# Patient Record
Sex: Male | Born: 1955
Health system: Southern US, Community
[De-identification: ages and names within clinical notes are randomized; demographics above are authoritative.]

## PROBLEM LIST (undated history)

## (undated) DIAGNOSIS — R7303 Prediabetes: Secondary | ICD-10-CM

## (undated) DIAGNOSIS — C4490 Unspecified malignant neoplasm of skin, unspecified: Secondary | ICD-10-CM

## (undated) DIAGNOSIS — E785 Hyperlipidemia, unspecified: Secondary | ICD-10-CM

## (undated) DIAGNOSIS — G4700 Insomnia, unspecified: Secondary | ICD-10-CM

## (undated) DIAGNOSIS — K219 Gastro-esophageal reflux disease without esophagitis: Secondary | ICD-10-CM

## (undated) DIAGNOSIS — F909 Attention-deficit hyperactivity disorder, unspecified type: Secondary | ICD-10-CM

## (undated) DIAGNOSIS — I10 Essential (primary) hypertension: Secondary | ICD-10-CM

## (undated) DIAGNOSIS — F17200 Nicotine dependence, unspecified, uncomplicated: Secondary | ICD-10-CM

## (undated) DIAGNOSIS — G2581 Restless legs syndrome: Secondary | ICD-10-CM

## (undated) DIAGNOSIS — G5622 Lesion of ulnar nerve, left upper limb: Secondary | ICD-10-CM

## (undated) DIAGNOSIS — M1990 Unspecified osteoarthritis, unspecified site: Secondary | ICD-10-CM

## (undated) HISTORY — DX: Hyperlipidemia, unspecified: E78.5

## (undated) HISTORY — DX: Unspecified osteoarthritis, unspecified site: M19.90

## (undated) HISTORY — DX: Lesion of ulnar nerve, left upper limb: G56.22

## (undated) HISTORY — DX: Attention-deficit hyperactivity disorder, unspecified type: F90.9

## (undated) HISTORY — PX: SKIN CANCER EXCISION: SHX779

## (undated) HISTORY — DX: Nicotine dependence, unspecified, uncomplicated: F17.200

## (undated) HISTORY — PX: THROAT SURGERY: SHX803

## (undated) HISTORY — DX: Restless legs syndrome: G25.81

## (undated) HISTORY — PX: WISDOM TOOTH EXTRACTION: SHX21

## (undated) HISTORY — PX: PROSTATE BIOPSY: SHX241

---

## 2012-03-16 ENCOUNTER — Encounter: Payer: Self-pay | Admitting: Internal Medicine

## 2012-03-16 ENCOUNTER — Ambulatory Visit (INDEPENDENT_AMBULATORY_CARE_PROVIDER_SITE_OTHER): Payer: PRIVATE HEALTH INSURANCE | Admitting: Internal Medicine

## 2012-03-16 ENCOUNTER — Ambulatory Visit: Payer: PRIVATE HEALTH INSURANCE

## 2012-03-16 VITALS — BP 128/88 | HR 67 | Temp 98.2°F | Resp 16 | Ht 68.0 in | Wt 182.0 lb

## 2012-03-16 DIAGNOSIS — Z0000 Encounter for general adult medical examination without abnormal findings: Secondary | ICD-10-CM

## 2012-03-16 DIAGNOSIS — G4700 Insomnia, unspecified: Secondary | ICD-10-CM

## 2012-03-16 DIAGNOSIS — N529 Male erectile dysfunction, unspecified: Secondary | ICD-10-CM

## 2012-03-16 DIAGNOSIS — F17200 Nicotine dependence, unspecified, uncomplicated: Secondary | ICD-10-CM | POA: Insufficient documentation

## 2012-03-16 DIAGNOSIS — F988 Other specified behavioral and emotional disorders with onset usually occurring in childhood and adolescence: Secondary | ICD-10-CM

## 2012-03-16 DIAGNOSIS — F909 Attention-deficit hyperactivity disorder, unspecified type: Secondary | ICD-10-CM

## 2012-03-16 LAB — COMPREHENSIVE METABOLIC PANEL
AST: 23 U/L (ref 0–37)
Albumin: 4.2 g/dL (ref 3.5–5.2)
Alkaline Phosphatase: 58 U/L (ref 39–117)
BUN: 14 mg/dL (ref 6–23)
Creat: 0.89 mg/dL (ref 0.50–1.35)
Glucose, Bld: 97 mg/dL (ref 70–99)
Potassium: 3.8 mEq/L (ref 3.5–5.3)

## 2012-03-16 LAB — POCT UA - MICROSCOPIC ONLY
Bacteria, U Microscopic: NEGATIVE
Casts, Ur, LPF, POC: NEGATIVE
Crystals, Ur, HPF, POC: NEGATIVE
Mucus, UA: NEGATIVE
Yeast, UA: NEGATIVE

## 2012-03-16 LAB — LIPID PANEL
Cholesterol: 188 mg/dL (ref 0–200)
Total CHOL/HDL Ratio: 3.6 Ratio
Triglycerides: 190 mg/dL — ABNORMAL HIGH (ref <150)

## 2012-03-16 LAB — POCT URINALYSIS DIPSTICK
Blood, UA: NEGATIVE
Glucose, UA: NEGATIVE
Nitrite, UA: NEGATIVE
Spec Grav, UA: 1.02
Urobilinogen, UA: 1
pH, UA: 7.5

## 2012-03-16 MED ORDER — ZOLPIDEM TARTRATE 10 MG PO TABS: 10.0000 mg | ORAL_TABLET | Freq: Every evening | ORAL | Status: DC | PRN

## 2012-03-16 MED ORDER — AMPHETAMINE-DEXTROAMPHET ER 20 MG PO CP24: 20.0000 mg | ORAL_CAPSULE | ORAL | Status: DC

## 2012-03-16 MED ORDER — TADALAFIL 20 MG PO TABS: 20.0000 mg | ORAL_TABLET | Freq: Every day | ORAL | Status: DC | PRN

## 2012-03-16 NOTE — Progress Notes (Signed)
  Subjective:    Patient ID: [name], male    DOB: October 10, 1956, 56 y.o.   MRN: [id number]  HPI Hx ADD, ED, Insomnia all chronic issues and none severe.  See scanned hx Review of Systems See scanned ROS    Objective:   Physical Exam  Constitutional: He is oriented to person, place, and time. He appears well-developed and well-nourished.  HENT:  Head: Normocephalic.  Nose: Nose normal.  Eyes: EOM are normal.  Neck: Normal range of motion. Neck supple. No thyromegaly present.  Cardiovascular: Normal rate, regular rhythm and normal heart sounds.   Pulmonary/Chest: Breath sounds normal.  Abdominal: Soft. Bowel sounds are normal. He exhibits no mass.  Genitourinary: Rectum normal, prostate normal and penis normal.  Musculoskeletal: Normal range of motion.  Lymphadenopathy:    He has no cervical adenopathy.  Neurological: He is alert and oriented to person, place, and time. He has normal reflexes. No cranial nerve deficit. Coordination normal.  Skin: Skin is dry.  Psychiatric: He has a normal mood and affect.          Assessment & Plan:  Refer for colonoscopy Trial of adderallxr 20mg  qd Trial zolpidem 10mg  qh RF viagra 50mg  RTC 1 month

## 2012-03-16 NOTE — Patient Instructions (Signed)
Attention Deficit Hyperactivity Disorder Attention deficit hyperactivity disorder (ADHD) is a problem with behavior issues based on the way the brain functions (neurobehavioral disorder). It is a common reason for behavior and academic problems in school. CAUSES  The cause of ADHD is unknown in most cases. It may run in families. It sometimes can be associated with learning disabilities and other behavioral problems. SYMPTOMS  There are 3 types of ADHD. The 3 types and some of the symptoms include:  Inattentive   Gets bored or distracted easily.   Loses or forgets things. Forgets to hand in homework.   Has trouble organizing or completing tasks.   Difficulty staying on task.   An inability to organize daily tasks and school work.   Leaving projects, chores, or homework unfinished.   Trouble paying attention or responding to details. Careless mistakes.   Difficulty following directions. Often seems like is not listening.   Dislikes activities that require sustained attention (like chores or homework).   Hyperactive-impulsive   Feels like it is impossible to sit still or stay in a seat. Fidgeting with hands and feet.   Trouble waiting turn.   Talking too much or out of turn. Interruptive.   Speaks or acts impulsively.   Aggressive, disruptive behavior.   Constantly busy or on the go, noisy.   Combined   Has symptoms of both of the above.  Often children with ADHD feel discouraged about themselves and with school. They often perform well below their abilities in school. These symptoms can cause problems in home, school, and in relationships with peers. As children get older, the excess motor activities can calm down, but the problems with paying attention and staying organized persist. Most children do not outgrow ADHD but with good treatment can learn to cope with the symptoms. DIAGNOSIS  When ADHD is suspected, the diagnosis should be made by professionals trained in  ADHD.  Diagnosis will include:  Ruling out other reasons for the child's behavior.   The caregivers will check with the child's school and check their medical records.   They will talk to teachers and parents.   Behavior rating scales for the child will be filled out by those dealing with the child on a daily basis.  A diagnosis is made only after all information has been considered. TREATMENT  Treatment usually includes behavioral treatment often along with medicines. It may include stimulant medicines. The stimulant medicines decrease impulsivity and hyperactivity and increase attention. Other medicines used include antidepressants and certain blood pressure medicines. Most experts agree that treatment for ADHD should address all aspects of the child's functioning. Treatment should not be limited to the use of medicines alone. Treatment should include structured classroom management. The parents must receive education to address rewarding good behavior, discipline, and limit-setting. Tutoring or behavioral therapy or both should be available for the child. If untreated, the disorder can have long-term serious effects into adolescence and adulthood. HOME CARE INSTRUCTIONS   Often with ADHD there is a lot of frustration among the family in dealing with the illness. There is often blame and anger that is not warranted. This is a life long illness. There is no way to prevent ADHD. In many cases, because the problem affects the family as a whole, the entire family may need help. A therapist can help the family find better ways to handle the disruptive behaviors and promote change. If the child is young, most of the therapist's work is with the parents. Parents will   learn techniques for coping with and improving their child's behavior. Sometimes only the child with the ADHD needs counseling. Your caregivers can help you make these decisions.   Children with ADHD may need help in organizing. Some  helpful tips include:   Keep routines the same every day from wake-up time to bedtime. Schedule everything. This includes homework and playtime. This should include outdoor and indoor recreation. Keep the schedule on the refrigerator or a bulletin board where it is frequently seen. Mark schedule changes as far in advance as possible.   Have a place for everything and keep everything in its place. This includes clothing, backpacks, and school supplies.   Encourage writing down assignments and bringing home needed books.   Offer your child a well-balanced diet. Breakfast is especially important for school performance. Children should avoid drinks with caffeine including:   Soft drinks.   Coffee.   Tea.   However, some older children (adolescents) may find these drinks helpful in improving their attention.   Children with ADHD need consistent rules that they can understand and follow. If rules are followed, give small rewards. Children with ADHD often receive, and expect, criticism. Look for good behavior and praise it. Set realistic goals. Give clear instructions. Look for activities that can foster success and self-esteem. Make time for pleasant activities with your child. Give lots of affection.   Parents are their children's greatest advocates. Learn as much as possible about ADHD. This helps you become a stronger and better advocate for your child. It also helps you educate your child's teachers and instructors if they feel inadequate in these areas. Parent support groups are often helpful. A national group with local chapters is called CHADD (Children and Adults with Attention Deficit Hyperactivity Disorder).  PROGNOSIS  There is no cure for ADHD. Children with the disorder seldom outgrow it. Many find adaptive ways to accommodate the ADHD as they mature. SEEK MEDICAL CARE IF:  Your child has repeated muscle twitches, cough or speech outbursts.   Your child has sleep problems.   Your  child has a marked loss of appetite.   Your child develops depression.   Your child has new or worsening behavioral problems.   Your child develops dizziness.   Your child has a racing heart.   Your child has stomach pains.   Your child develops headaches.  Document Released: 11/08/2002 Document Revised: 11/07/2011 Document Reviewed: 06/20/2008 [facility] Patient Information 2012 [place], [place].Insomnia Insomnia is frequent trouble falling and/or staying asleep. Insomnia can be a long term problem or a short term problem. Both are common. Insomnia can be a short term problem when the wakefulness is related to a certain stress or worry. Long term insomnia is often related to ongoing stress during waking hours and/or poor sleeping habits. Overtime, sleep deprivation itself can make the problem worse. Every little thing feels more severe because you are overtired and your ability to cope is decreased. CAUSES   Stress, anxiety, and depression.   Poor sleeping habits.   Distractions such as TV in the bedroom.   Naps close to bedtime.   Engaging in emotionally charged conversations before bed.   Technical reading before sleep.   Alcohol and other sedatives. They may make the problem worse. They can hurt normal sleep patterns and normal dream activity.   Stimulants such as caffeine for several hours prior to bedtime.   Pain syndromes and shortness of breath can cause insomnia.   Exercise late at night.   Changing  time zones may cause sleeping problems (jet lag).  It is sometimes helpful to have someone observe your sleeping patterns. They should look for periods of not breathing during the night (sleep apnea). They should also look to see how long those periods last. If you live alone or observers are uncertain, you can also be observed at a sleep clinic where your sleep patterns will be professionally monitored. Sleep apnea requires a checkup and treatment. Give your caregivers your  medical history. Give your caregivers observations your family has made about your sleep.  SYMPTOMS   Not feeling rested in the morning.   Anxiety and restlessness at bedtime.   Difficulty falling and staying asleep.  TREATMENT   Your caregiver may prescribe treatment for an underlying medical disorders. Your caregiver can give advice or help if you are using alcohol or other drugs for self-medication. Treatment of underlying problems will usually eliminate insomnia problems.   Medications can be prescribed for short time use. They are generally not recommended for lengthy use.   Over-the-counter sleep medicines are not recommended for lengthy use. They can be habit forming.   You can promote easier sleeping by making lifestyle changes such as:   Using relaxation techniques that help with breathing and reduce muscle tension.   Exercising earlier in the day.   Changing your diet and the time of your last meal. No night time snacks.   Establish a regular time to go to bed.   Counseling can help with stressful problems and worry.   Soothing music and white noise may be helpful if there are background noises you cannot remove.   Stop tedious detailed work at least one hour before bedtime.  HOME CARE INSTRUCTIONS   Keep a diary. Inform your caregiver about your progress. This includes any medication side effects. See your caregiver regularly. Take note of:   Times when you are asleep.   Times when you are awake during the night.   The quality of your sleep.   How you feel the next day.  This information will help your caregiver care for you.  Get out of bed if you are still awake after 15 minutes. Read or do some quiet activity. Keep the lights down. Wait until you feel sleepy and go back to bed.   Keep regular sleeping and waking hours. Avoid naps.   Exercise regularly.   Avoid distractions at bedtime. Distractions include watching television or engaging in any intense  or detailed activity like attempting to balance the household checkbook.   Develop a bedtime ritual. Keep a familiar routine of bathing, brushing your teeth, climbing into bed at the same time each night, listening to soothing music. Routines increase the success of falling to sleep faster.   Use relaxation techniques. This can be using breathing and muscle tension release routines. It can also include visualizing peaceful scenes. You can also help control troubling or intruding thoughts by keeping your mind occupied with boring or repetitive thoughts like the old concept of counting sheep. You can make it more creative like imagining planting one beautiful flower after another in your backyard garden.   During your day, work to eliminate stress. When this is not possible use some of the previous suggestions to help reduce the anxiety that accompanies stressful situations.  MAKE SURE YOU:   Understand these instructions.   Will watch your condition.   Will get help right away if you are not doing well or get worse.  Document Released:  11/15/2000 Document Revised: 11/07/2011 Document Reviewed: 12/16/2007 [facility] Patient Information 2012 [place], [place].

## 2012-03-17 LAB — PSA: PSA: 2.75 ng/mL (ref <=4.00)

## 2012-03-18 ENCOUNTER — Encounter: Payer: Self-pay | Admitting: Gastroenterology

## 2012-04-08 ENCOUNTER — Telehealth: Payer: Self-pay | Admitting: *Deleted

## 2012-04-08 NOTE — Telephone Encounter (Signed)
Pt did not show for previsit appointment 04-08-12 9:00 am. Message left to call and reschedule previsit appointment by 5pm today or colonoscopy scheduled 04-22-12 would be cancelled and both previsit and colonoscopy appointments would need to be rescheduled.

## 2012-04-13 ENCOUNTER — Ambulatory Visit (AMBULATORY_SURGERY_CENTER): Payer: Self-pay | Admitting: *Deleted

## 2012-04-13 VITALS — Ht 68.5 in | Wt 183.6 lb

## 2012-04-13 DIAGNOSIS — Z1211 Encounter for screening for malignant neoplasm of colon: Secondary | ICD-10-CM

## 2012-04-13 MED ORDER — PEG-KCL-NACL-NASULF-NA ASC-C 100 G PO SOLR: ORAL | Status: DC

## 2012-04-22 ENCOUNTER — Encounter: Payer: Self-pay | Admitting: Gastroenterology

## 2012-04-22 ENCOUNTER — Ambulatory Visit (AMBULATORY_SURGERY_CENTER): Payer: PRIVATE HEALTH INSURANCE | Admitting: Gastroenterology

## 2012-04-22 VITALS — BP 123/71 | HR 78 | Temp 98.6°F | Resp 20 | Ht 68.0 in | Wt 183.0 lb

## 2012-04-22 DIAGNOSIS — Z1211 Encounter for screening for malignant neoplasm of colon: Secondary | ICD-10-CM

## 2012-04-22 DIAGNOSIS — D126 Benign neoplasm of colon, unspecified: Secondary | ICD-10-CM

## 2012-04-22 MED ORDER — SODIUM CHLORIDE 0.9 % IV SOLN: 500.0000 mL | INTRAVENOUS | Status: DC

## 2012-04-22 NOTE — Op Note (Signed)
[facility] [address] [place], [place]  [address]  COLONOSCOPY PROCEDURE REPORT  PATIENT:  [name], [name]  MR#:  [id number] BIRTHDATE:  October 19, 1956, 55 yrs. old  GENDER:  male ENDOSCOPIST:  Vania Rea. Jarold Motto, MD, [facility] REF. BY:  Robert Bellow, M.D. PROCEDURE DATE:  04/22/2012 PROCEDURE:  Average-risk screening colonoscopy G0121 ASA CLASS:  Class I INDICATIONS:  Routine Risk Screening MEDICATIONS:   propofol (Diprivan) 550 iv  DESCRIPTION OF PROCEDURE:   After the risks and benefits and of the procedure were explained, informed consent was obtained. Digital rectal exam was performed and revealed no abnormalities. The LB CF-H180AL [id number] endoscope was introduced through the anus and advanced to the cecum, which was identified by both the appendix and ileocecal valve.  The quality of the prep was good, using MoviPrep.  The instrument was then slowly withdrawn as the colon was fully examined. <<PROCEDUREIMAGES>>  FINDINGS:  A sessile polyp was found. 3-4 mm flat polyp seen on a sharp twist in very redundant,redundant left con at or near spleenic flexure.COUD NOT POSITION THE SCOPE TO REMOVE !!  This was otherwise a normal examination of the colon. SEE PICTURES. Retroflexed views in the rectum revealed no abnormalities.    The scope was then withdrawn from the patient and the procedure completed.  COMPLICATIONS:  None ENDOSCOPIC IMPRESSION: 1) Sessile polyp 2) Otherwise normal examination RECOMMENDATIONS: 1) Repeat Colonoscopy in 3 years.  REPEAT EXAM:  No  ______________________________ Vania Rea. Jarold Motto, MD, [facility]  CC:  n. eSIGNED:   Vania Rea. Anatole Apollo at 04/22/2012 10:26 AM  Otelia Santee, [phone]

## 2012-04-22 NOTE — Progress Notes (Signed)
Patient did not experience any of the following events: a burn prior to discharge; a fall within the facility; wrong site/side/patient/procedure/implant event; or a hospital transfer or hospital admission upon discharge from the facility. (G8907) Patient did not have preoperative order for IV antibiotic SSI prophylaxis. (G8918)  

## 2012-04-22 NOTE — Patient Instructions (Signed)
Impressions/Recommendations:  Polyp (handout given) Unable to remove.  Repeat colonoscopy in 3 years.  YOU HAD AN ENDOSCOPIC PROCEDURE TODAY AT THE [facility]: Refer to the procedure report that was given to you for any specific questions about what was found during the examination.  If the procedure report does not answer your questions, please call your gastroenterologist to clarify.  If you requested that your care partner not be given the details of your procedure findings, then the procedure report has been included in a sealed envelope for you to review at your convenience later.  YOU SHOULD EXPECT: Some feelings of bloating in the abdomen. Passage of more gas than usual.  Walking can help get rid of the air that was put into your GI tract during the procedure and reduce the bloating. If you had a lower endoscopy (such as a colonoscopy or flexible sigmoidoscopy) you may notice spotting of blood in your stool or on the toilet paper. If you underwent a bowel prep for your procedure, then you may not have a normal bowel movement for a few days.  DIET: Your first meal following the procedure should be a light meal and then it is ok to progress to your normal diet.  A half-sandwich or bowl of soup is an example of a good first meal.  Heavy or fried foods are harder to digest and may make you feel nauseous or bloated.  Likewise meals heavy in dairy and vegetables can cause extra gas to form and this can also increase the bloating.  Drink plenty of fluids but you should avoid alcoholic beverages for 24 hours.  ACTIVITY: Your care partner should take you home directly after the procedure.  You should plan to take it easy, moving slowly for the rest of the day.  You can resume normal activity the day after the procedure however you should NOT DRIVE or use heavy machinery for 24 hours (because of the sedation medicines used during the test).    SYMPTOMS TO REPORT IMMEDIATELY: A  gastroenterologist can be reached at any hour.  During normal business hours, 8:30 AM to 5:00 PM Monday through Friday, call [phone].  After hours and on weekends, please call the GI answering service at [phone] who will take a message and have the physician on call contact you.   Following lower endoscopy (colonoscopy or flexible sigmoidoscopy):  Excessive amounts of blood in the stool  Significant tenderness or worsening of abdominal pains  Swelling of the abdomen that is new, acute  Fever of 100F or higher  Following upper endoscopy (EGD)  Vomiting of blood or coffee ground material  New chest pain or pain under the shoulder blades  Painful or persistently difficult swallowing  New shortness of breath  Fever of 100F or higher  Black, tarry-looking stools  FOLLOW UP: If any biopsies were taken you will be contacted by phone or by letter within the next 1-3 weeks.  Call your gastroenterologist if you have not heard about the biopsies in 3 weeks.  Our staff will call the home number listed on your records the next business day following your procedure to check on you and address any questions or concerns that you may have at that time regarding the information given to you following your procedure. This is a courtesy call and so if there is no answer at the home number and we have not heard from you through the emergency physician on call, we will assume that you  have returned to your regular daily activities without incident.  SIGNATURES/CONFIDENTIALITY: You and/or your care partner have signed paperwork which will be entered into your electronic medical record.  These signatures attest to the fact that that the information above on your After Visit Summary has been reviewed and is understood.  Full responsibility of the confidentiality of this discharge information lies with you and/or your care-partner.  

## 2012-04-22 NOTE — Progress Notes (Signed)
Propofol given and oxygen managed per D Merritt CRNA 

## 2012-04-23 ENCOUNTER — Telehealth: Payer: Self-pay

## 2012-04-23 NOTE — Telephone Encounter (Signed)
  Follow up Call-  Call back number 04/22/2012  Post procedure Call Back phone  # [phone]  Permission to leave phone message Yes     Patient questions:  Do you have a fever, pain , or abdominal swelling? no Pain Score  0 *  Have you tolerated food without any problems? yes  Have you been able to return to your normal activities? yes  Do you have any questions about your discharge instructions: Diet   no Medications  no Follow up visit  no  Do you have questions or concerns about your Care? no  Actions: * If pain score is 4 or above: No action needed, pain <4.

## 2012-05-04 ENCOUNTER — Ambulatory Visit (INDEPENDENT_AMBULATORY_CARE_PROVIDER_SITE_OTHER): Payer: PRIVATE HEALTH INSURANCE | Admitting: Internal Medicine

## 2012-05-04 ENCOUNTER — Encounter: Payer: Self-pay | Admitting: Internal Medicine

## 2012-05-04 VITALS — BP 144/98 | HR 76 | Temp 96.9°F | Resp 16 | Ht 68.0 in | Wt 177.8 lb

## 2012-05-04 DIAGNOSIS — F988 Other specified behavioral and emotional disorders with onset usually occurring in childhood and adolescence: Secondary | ICD-10-CM

## 2012-05-04 DIAGNOSIS — N529 Male erectile dysfunction, unspecified: Secondary | ICD-10-CM

## 2012-05-04 DIAGNOSIS — Z7189 Other specified counseling: Secondary | ICD-10-CM

## 2012-05-04 DIAGNOSIS — G4700 Insomnia, unspecified: Secondary | ICD-10-CM

## 2012-05-04 DIAGNOSIS — F909 Attention-deficit hyperactivity disorder, unspecified type: Secondary | ICD-10-CM

## 2012-05-04 MED ORDER — AMPHETAMINE-DEXTROAMPHET ER 20 MG PO CP24: 20.0000 mg | ORAL_CAPSULE | ORAL | Status: DC

## 2012-05-04 NOTE — Progress Notes (Signed)
  Subjective:    Patient ID: [name], male    DOB: July 20, 1956, 56 y.o.   MRN: [id number]  HPI ADHD is much better,work is very busy and he is clearly more efficient. BP elevated a little, appetite ok, and no other side affects  Needs baseline EKG    Review of Systems    see ROS scanned Objective:   Physical Exam COR normal BP  EKG nl     Assessment & Plan:  ADHD improved  Will continue adderall xr 20mg  qd,  Ok to refill 4 mos.

## 2012-05-04 NOTE — Patient Instructions (Signed)
Attention Deficit Hyperactivity Disorder Attention deficit hyperactivity disorder (ADHD) is a problem with behavior issues based on the way the brain functions (neurobehavioral disorder). It is a common reason for behavior and academic problems in school. CAUSES  The cause of ADHD is unknown in most cases. It may run in families. It sometimes can be associated with learning disabilities and other behavioral problems. SYMPTOMS  There are 3 types of ADHD. The 3 types and some of the symptoms include:  Inattentive   Gets bored or distracted easily.   Loses or forgets things. Forgets to hand in homework.   Has trouble organizing or completing tasks.   Difficulty staying on task.   An inability to organize daily tasks and school work.   Leaving projects, chores, or homework unfinished.   Trouble paying attention or responding to details. Careless mistakes.   Difficulty following directions. Often seems like is not listening.   Dislikes activities that require sustained attention (like chores or homework).   Hyperactive-impulsive   Feels like it is impossible to sit still or stay in a seat. Fidgeting with hands and feet.   Trouble waiting turn.   Talking too much or out of turn. Interruptive.   Speaks or acts impulsively.   Aggressive, disruptive behavior.   Constantly busy or on the go, noisy.   Combined   Has symptoms of both of the above.  Often children with ADHD feel discouraged about themselves and with school. They often perform well below their abilities in school. These symptoms can cause problems in home, school, and in relationships with peers. As children get older, the excess motor activities can calm down, but the problems with paying attention and staying organized persist. Most children do not outgrow ADHD but with good treatment can learn to cope with the symptoms. DIAGNOSIS  When ADHD is suspected, the diagnosis should be made by professionals trained in  ADHD.  Diagnosis will include:  Ruling out other reasons for the child's behavior.   The caregivers will check with the child's school and check their medical records.   They will talk to teachers and parents.   Behavior rating scales for the child will be filled out by those dealing with the child on a daily basis.  A diagnosis is made only after all information has been considered. TREATMENT  Treatment usually includes behavioral treatment often along with medicines. It may include stimulant medicines. The stimulant medicines decrease impulsivity and hyperactivity and increase attention. Other medicines used include antidepressants and certain blood pressure medicines. Most experts agree that treatment for ADHD should address all aspects of the child's functioning. Treatment should not be limited to the use of medicines alone. Treatment should include structured classroom management. The parents must receive education to address rewarding good behavior, discipline, and limit-setting. Tutoring or behavioral therapy or both should be available for the child. If untreated, the disorder can have long-term serious effects into adolescence and adulthood. HOME CARE INSTRUCTIONS   Often with ADHD there is a lot of frustration among the family in dealing with the illness. There is often blame and anger that is not warranted. This is a life long illness. There is no way to prevent ADHD. In many cases, because the problem affects the family as a whole, the entire family may need help. A therapist can help the family find better ways to handle the disruptive behaviors and promote change. If the child is young, most of the therapist's work is with the parents. Parents will   learn techniques for coping with and improving their child's behavior. Sometimes only the child with the ADHD needs counseling. Your caregivers can help you make these decisions.   Children with ADHD may need help in organizing. Some  helpful tips include:   Keep routines the same every day from wake-up time to bedtime. Schedule everything. This includes homework and playtime. This should include outdoor and indoor recreation. Keep the schedule on the refrigerator or a bulletin board where it is frequently seen. Mark schedule changes as far in advance as possible.   Have a place for everything and keep everything in its place. This includes clothing, backpacks, and school supplies.   Encourage writing down assignments and bringing home needed books.   Offer your child a well-balanced diet. Breakfast is especially important for school performance. Children should avoid drinks with caffeine including:   Soft drinks.   Coffee.   Tea.   However, some older children (adolescents) may find these drinks helpful in improving their attention.   Children with ADHD need consistent rules that they can understand and follow. If rules are followed, give small rewards. Children with ADHD often receive, and expect, criticism. Look for good behavior and praise it. Set realistic goals. Give clear instructions. Look for activities that can foster success and self-esteem. Make time for pleasant activities with your child. Give lots of affection.   Parents are their children's greatest advocates. Learn as much as possible about ADHD. This helps you become a stronger and better advocate for your child. It also helps you educate your child's teachers and instructors if they feel inadequate in these areas. Parent support groups are often helpful. A national group with local chapters is called CHADD (Children and Adults with Attention Deficit Hyperactivity Disorder).  PROGNOSIS  There is no cure for ADHD. Children with the disorder seldom outgrow it. Many find adaptive ways to accommodate the ADHD as they mature. SEEK MEDICAL CARE IF:  Your child has repeated muscle twitches, cough or speech outbursts.   Your child has sleep problems.   Your  child has a marked loss of appetite.   Your child develops depression.   Your child has new or worsening behavioral problems.   Your child develops dizziness.   Your child has a racing heart.   Your child has stomach pains.   Your child develops headaches.  Document Released: 11/08/2002 Document Revised: 11/07/2011 Document Reviewed: 06/20/2008 [employer] Patient Information 2012 [employer]. 

## 2012-05-20 ENCOUNTER — Other Ambulatory Visit: Payer: Self-pay | Admitting: Internal Medicine

## 2012-05-27 ENCOUNTER — Encounter: Payer: Self-pay | Admitting: Internal Medicine

## 2012-06-29 ENCOUNTER — Other Ambulatory Visit: Payer: Self-pay | Admitting: *Deleted

## 2012-06-29 ENCOUNTER — Other Ambulatory Visit: Payer: Self-pay | Admitting: Physician Assistant

## 2012-07-03 ENCOUNTER — Other Ambulatory Visit: Payer: Self-pay | Admitting: Physician Assistant

## 2012-10-19 ENCOUNTER — Encounter: Payer: Self-pay | Admitting: Internal Medicine

## 2012-10-19 ENCOUNTER — Ambulatory Visit (INDEPENDENT_AMBULATORY_CARE_PROVIDER_SITE_OTHER): Payer: PRIVATE HEALTH INSURANCE | Admitting: Internal Medicine

## 2012-10-19 VITALS — BP 134/84 | HR 68 | Temp 98.0°F | Resp 16 | Ht 69.0 in | Wt 186.0 lb

## 2012-10-19 DIAGNOSIS — F909 Attention-deficit hyperactivity disorder, unspecified type: Secondary | ICD-10-CM

## 2012-10-19 DIAGNOSIS — N529 Male erectile dysfunction, unspecified: Secondary | ICD-10-CM

## 2012-10-19 DIAGNOSIS — Z7189 Other specified counseling: Secondary | ICD-10-CM

## 2012-10-19 DIAGNOSIS — G4700 Insomnia, unspecified: Secondary | ICD-10-CM

## 2012-10-19 DIAGNOSIS — Z79899 Other long term (current) drug therapy: Secondary | ICD-10-CM

## 2012-10-19 MED ORDER — ZOLPIDEM TARTRATE 10 MG PO TABS: 10.0000 mg | ORAL_TABLET | Freq: Every evening | ORAL | Status: DC | PRN

## 2012-10-19 MED ORDER — AMPHETAMINE-DEXTROAMPHET ER 20 MG PO CP24: 20.0000 mg | ORAL_CAPSULE | ORAL | Status: DC

## 2012-10-19 MED ORDER — TADALAFIL 20 MG PO TABS: 20.0000 mg | ORAL_TABLET | Freq: Every day | ORAL | Status: DC | PRN

## 2012-10-19 NOTE — Progress Notes (Signed)
  Subjective:    Patient ID: [name], male    DOB: 1956-11-14, 56 y.o.   MRN: [id number]  HPI ADHD doing well with prn big work day adderall Insomnia doing well with prn zopidem ED doing well with prn cialis   Review of Systems     Objective:   Physical Exam  Constitutional: He is oriented to person, place, and time. He appears well-developed and well-nourished. No distress.  Eyes: EOM are normal.  Cardiovascular: Normal rate, regular rhythm and normal heart sounds.   Pulmonary/Chest: Breath sounds normal.  Neurological: He is alert and oriented to person, place, and time. Coordination normal.  Psychiatric: He has a normal mood and affect. His behavior is normal. Judgment and thought content normal.          Assessment & Plan:  RF adderall xr 20mg  3 mos RF zolpidem 10mg  6 mos Rf cialis 1 yr

## 2012-10-19 NOTE — Patient Instructions (Signed)

## 2012-12-10 ENCOUNTER — Telehealth: Payer: Self-pay

## 2012-12-10 NOTE — Telephone Encounter (Signed)
Patient wants to know if he had the chicken poxs.  Best number [phone]

## 2012-12-10 NOTE — Telephone Encounter (Signed)
Spoke with pt advised we dont have any record of him having chicken pox. I offered to have him RTC to check a varicella titer, pt will see if he can get it done at his physical.

## 2012-12-18 ENCOUNTER — Telehealth: Payer: Self-pay

## 2012-12-18 DIAGNOSIS — N529 Male erectile dysfunction, unspecified: Secondary | ICD-10-CM

## 2012-12-18 DIAGNOSIS — Z7189 Other specified counseling: Secondary | ICD-10-CM

## 2012-12-18 DIAGNOSIS — G4700 Insomnia, unspecified: Secondary | ICD-10-CM

## 2012-12-18 DIAGNOSIS — F909 Attention-deficit hyperactivity disorder, unspecified type: Secondary | ICD-10-CM

## 2012-12-18 NOTE — Telephone Encounter (Signed)
Patient would like refill of adderall.

## 2012-12-18 NOTE — Telephone Encounter (Signed)
OK to rf 4 months then come see me

## 2012-12-20 NOTE — Telephone Encounter (Signed)
Dr Noel Gerold not allowed to print RX's for refills on Adderall. Can you print these and I will have you sign at your next scheduled shift? Thank you

## 2012-12-22 MED ORDER — AMPHETAMINE-DEXTROAMPHET ER 20 MG PO CP24: 20.0000 mg | ORAL_CAPSULE | ORAL | Status: DC

## 2012-12-22 NOTE — Telephone Encounter (Signed)
I need to review, and see when his is due, he may be okay for another couple months. He may actually may not be due for follow up until April.

## 2012-12-22 NOTE — Telephone Encounter (Signed)
Ok this month then due for follow up

## 2012-12-23 ENCOUNTER — Telehealth: Payer: Self-pay

## 2012-12-23 NOTE — Telephone Encounter (Signed)
RX printed, awaiting signature from Dr Perrin Maltese. He will not be in until Friday. Pt notified about status.

## 2012-12-23 NOTE — Telephone Encounter (Signed)
error 

## 2012-12-23 NOTE — Telephone Encounter (Signed)
Patient called about rx. Spoke to [place], she will see if Dr Perrin Maltese can print it. Please call patient when ready.  Best [phone]

## 2012-12-24 NOTE — Telephone Encounter (Signed)
Patients rx ready to be picked up, he is advised.

## 2013-02-03 ENCOUNTER — Telehealth: Payer: Self-pay

## 2013-02-03 DIAGNOSIS — Z7189 Other specified counseling: Secondary | ICD-10-CM

## 2013-02-03 DIAGNOSIS — F909 Attention-deficit hyperactivity disorder, unspecified type: Secondary | ICD-10-CM

## 2013-02-03 DIAGNOSIS — N529 Male erectile dysfunction, unspecified: Secondary | ICD-10-CM

## 2013-02-03 DIAGNOSIS — G4700 Insomnia, unspecified: Secondary | ICD-10-CM

## 2013-02-03 NOTE — Telephone Encounter (Signed)
PT IN NEED OF HIS ADDERALL. PLEASE CALL [phone] WHEN READY FOR P/U

## 2013-02-11 ENCOUNTER — Other Ambulatory Visit: Payer: Self-pay | Admitting: Radiology

## 2013-02-11 ENCOUNTER — Telehealth: Payer: Self-pay

## 2013-02-11 ENCOUNTER — Telehealth: Payer: Self-pay | Admitting: Radiology

## 2013-02-11 NOTE — Telephone Encounter (Signed)
Patient advised of need for office visit. He is given Dr Perrin Maltese office hours.he will come in tomorrow am.

## 2013-02-11 NOTE — Telephone Encounter (Signed)
PT WANTED TO MAKE SURE SOMEONE KNOW THAT HE IS REALLY UNHAPPY ABOUT THE WAY WE DO THINGS NOW AND NO ONE ELSE CAN AUTHORIZED A REFILL EXCEPT DR GUEST WOULD LIKE AN EXPLANATION ABOUT IT. WAITING ON HIS ADDERALL AND I ADVISED PT TO COME IN. PLEASE CALL [phone]

## 2013-02-11 NOTE — Telephone Encounter (Signed)
Policy for controlled substances explained to him again he can choose another provider if indicated. He is due for follow up regarding adderall refills anyway

## 2013-02-11 NOTE — Telephone Encounter (Signed)
Patient will be here in the am

## 2013-02-12 MED ORDER — AMPHETAMINE-DEXTROAMPHET ER 20 MG PO CP24: 20.0000 mg | ORAL_CAPSULE | ORAL | Status: DC

## 2013-02-12 NOTE — Addendum Note (Signed)
Addended by: Morrell Riddle on: 02/12/2013 08:51 AM   Modules accepted: Orders

## 2013-02-12 NOTE — Telephone Encounter (Signed)
Dr Perrin Maltese is unclear about his instruction for recheck.  He is out of the office and with out new policy for controlled substances and his absence I will write a 1 month Rx and then he Must see Dr Perrin Maltese (only- please explain the controlled substance policy to him) before he runs out) - an appt would be best.

## 2013-02-12 NOTE — Telephone Encounter (Signed)
Called pt and he came in to p/up Rx. Explained what new cont subs policy and changing FDA guidelines for Adderall. Pt agreed to f/up wDr Guest w/in the month and make sure he gives himself plenty of time for f/up visits due to Dr Ernestene Mention reduced hours.

## 2013-03-03 ENCOUNTER — Other Ambulatory Visit: Payer: Self-pay | Admitting: Internal Medicine

## 2013-03-05 ENCOUNTER — Other Ambulatory Visit: Payer: Self-pay | Admitting: Internal Medicine

## 2013-03-05 NOTE — Telephone Encounter (Signed)
ok 

## 2013-03-15 ENCOUNTER — Ambulatory Visit (INDEPENDENT_AMBULATORY_CARE_PROVIDER_SITE_OTHER): Payer: PRIVATE HEALTH INSURANCE | Admitting: Internal Medicine

## 2013-03-15 ENCOUNTER — Ambulatory Visit: Payer: PRIVATE HEALTH INSURANCE

## 2013-03-15 ENCOUNTER — Encounter: Payer: Self-pay | Admitting: Internal Medicine

## 2013-03-15 VITALS — BP 160/100 | HR 65 | Temp 97.8°F | Resp 16 | Ht 68.0 in | Wt 187.6 lb

## 2013-03-15 DIAGNOSIS — Z0000 Encounter for general adult medical examination without abnormal findings: Secondary | ICD-10-CM

## 2013-03-15 DIAGNOSIS — Z23 Encounter for immunization: Secondary | ICD-10-CM

## 2013-03-15 DIAGNOSIS — R9431 Abnormal electrocardiogram [ECG] [EKG]: Secondary | ICD-10-CM

## 2013-03-15 DIAGNOSIS — R0602 Shortness of breath: Secondary | ICD-10-CM

## 2013-03-15 DIAGNOSIS — G4700 Insomnia, unspecified: Secondary | ICD-10-CM

## 2013-03-15 DIAGNOSIS — F988 Other specified behavioral and emotional disorders with onset usually occurring in childhood and adolescence: Secondary | ICD-10-CM

## 2013-03-15 DIAGNOSIS — I440 Atrioventricular block, first degree: Secondary | ICD-10-CM

## 2013-03-15 DIAGNOSIS — R942 Abnormal results of pulmonary function studies: Secondary | ICD-10-CM

## 2013-03-15 DIAGNOSIS — N529 Male erectile dysfunction, unspecified: Secondary | ICD-10-CM

## 2013-03-15 DIAGNOSIS — F909 Attention-deficit hyperactivity disorder, unspecified type: Secondary | ICD-10-CM

## 2013-03-15 DIAGNOSIS — I10 Essential (primary) hypertension: Secondary | ICD-10-CM

## 2013-03-15 DIAGNOSIS — Z7189 Other specified counseling: Secondary | ICD-10-CM

## 2013-03-15 DIAGNOSIS — F17200 Nicotine dependence, unspecified, uncomplicated: Secondary | ICD-10-CM

## 2013-03-15 LAB — COMPREHENSIVE METABOLIC PANEL
ALT: 22 U/L (ref 0–53)
AST: 20 U/L (ref 0–37)
Albumin: 4.1 g/dL (ref 3.5–5.2)
BUN: 14 mg/dL (ref 6–23)
Calcium: 9 mg/dL (ref 8.4–10.5)
Chloride: 102 mEq/L (ref 96–112)
Potassium: 4 mEq/L (ref 3.5–5.3)

## 2013-03-15 LAB — IFOBT (OCCULT BLOOD): IFOBT: NEGATIVE

## 2013-03-15 LAB — TSH: TSH: 1.369 u[IU]/mL (ref 0.350–4.500)

## 2013-03-15 LAB — CBC WITH DIFFERENTIAL/PLATELET
Basophils Absolute: 0.1 10*3/uL (ref 0.0–0.1)
HCT: 47.4 % (ref 39.0–52.0)
Hemoglobin: 16.8 g/dL (ref 13.0–17.0)
Lymphocytes Relative: 29 % (ref 12–46)
Monocytes Absolute: 0.7 10*3/uL (ref 0.1–1.0)
Neutro Abs: 5.3 10*3/uL (ref 1.7–7.7)
RBC: 5.48 MIL/uL (ref 4.22–5.81)
RDW: 13.7 % (ref 11.5–15.5)
WBC: 8.9 10*3/uL (ref 4.0–10.5)

## 2013-03-15 LAB — LIPID PANEL
LDL Cholesterol: 102 mg/dL — ABNORMAL HIGH (ref 0–99)
Triglycerides: 236 mg/dL — ABNORMAL HIGH (ref <150)

## 2013-03-15 LAB — POCT UA - MICROSCOPIC ONLY: Yeast, UA: NEGATIVE

## 2013-03-15 LAB — POCT URINALYSIS DIPSTICK
Leukocytes, UA: NEGATIVE
Nitrite, UA: NEGATIVE
Protein, UA: NEGATIVE
Urobilinogen, UA: 0.2

## 2013-03-15 MED ORDER — ZOLPIDEM TARTRATE 10 MG PO TABS: 10.0000 mg | ORAL_TABLET | Freq: Every evening | ORAL | Status: DC | PRN

## 2013-03-15 MED ORDER — AMPHETAMINE-DEXTROAMPHET ER 20 MG PO CP24: 20.0000 mg | ORAL_CAPSULE | ORAL | Status: DC

## 2013-03-15 MED ORDER — LISINOPRIL 5 MG PO TABS: 5.0000 mg | ORAL_TABLET | Freq: Every day | ORAL | Status: DC

## 2013-03-15 MED ORDER — TADALAFIL 20 MG PO TABS: 20.0000 mg | ORAL_TABLET | Freq: Every day | ORAL | Status: DC | PRN

## 2013-03-15 NOTE — Patient Instructions (Signed)

## 2013-03-15 NOTE — Progress Notes (Signed)
  Subjective:    Patient ID: [name], male    DOB: [birth year], 57 y.o.   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 Has doe when exercises, has elevated right hemidiaphragm chronically. Smoker, 1/2 pk d, BP now elevated, etoh use. ED and insomnia controlled.   Review of Systems  Constitutional: Negative.   HENT: Negative.   Eyes: Negative.   Respiratory: Positive for shortness of breath.   Cardiovascular: Negative.   Gastrointestinal: Negative.   Endocrine: Negative.   Genitourinary: Negative.   Musculoskeletal: Negative.   Allergic/Immunologic: Negative.   Neurological: Negative.   Hematological: Negative.   Psychiatric/Behavioral: The patient is hyperactive.        Objective:   Physical Exam  Constitutional: He is oriented to person, place, and time. He appears well-developed and well-nourished. No distress.  HENT:  Right Ear: External ear normal.  Left Ear: External ear normal.  Nose: Nose normal.  Mouth/Throat: Oropharynx is clear and moist.  Eyes: Conjunctivae and EOM are normal. Pupils are equal, round, and reactive to light.  Neck: Normal range of motion. Neck supple. No thyromegaly present.  Cardiovascular: Normal rate, regular rhythm, normal heart sounds and intact distal pulses.   Pulmonary/Chest: Effort normal and breath sounds normal.  Abdominal: Soft. Bowel sounds are normal.  Genitourinary: Rectum normal, prostate normal and penis normal.  Musculoskeletal: Normal range of motion.  Lymphadenopathy:    He has no cervical adenopathy.  Neurological: He is alert and oriented to person, place, and time. He has normal reflexes. No cranial nerve deficit. He exhibits normal muscle tone. Coordination normal.  Skin: Skin is warm and dry. No rash noted.  Psychiatric: He has a normal mood and affect. His behavior is normal. Judgment and thought content normal.    EKG 1 degree av block stable [facility] reading  (PRIMARY) by  Dr Perrin Maltese elevated right hemidiaphragm, has doe. Results for orders placed in visit on 03/15/13  POCT URINALYSIS DIPSTICK      Result Value Range   Color, UA yellow     Clarity, UA clear     Glucose, UA neg     Bilirubin, UA neg     Ketones, UA neg     Spec Grav, UA 1.025     Blood, UA small     pH, UA 5.5     Protein, UA neg     Urobilinogen, UA 0.2     Nitrite, UA neg     Leukocytes, UA Negative    POCT UA - MICROSCOPIC ONLY      Result Value Range   WBC, Ur, HPF, POC 0-4     RBC, urine, microscopic 0-3     Bacteria, U Microscopic neg     Mucus, UA small     Epithelial cells, urine per micros 0-3     Crystals, Ur, HPF, POC neg     Casts, Ur, LPF, POC neg     Yeast, UA neg    IFOBT (OCCULT BLOOD)      Result Value Range   IFOBT Negative           Assessment & Plan:  Tdap/Pneumovax given Shingles vaccine ordered RF all meds 6 mos/3 mos of adderall xr given Must quit smoking Start Lisinopril 5 mg qd for htn/Dash Refer to pulmonary for DOE/Low PFTs

## 2013-03-15 NOTE — Progress Notes (Signed)
  Subjective:    Patient ID: [name], male    DOB: 1956-05-03, 57 y.o.   MRN: [id number]  HPI    Review of Systems  Constitutional: Negative.   HENT: Positive for neck stiffness.   Eyes: Negative.   Respiratory: Positive for shortness of breath.   Cardiovascular: Negative.   Gastrointestinal: Negative.   Endocrine: Negative.   Genitourinary: Negative.   Musculoskeletal: Positive for arthralgias.  Skin: Negative.   Allergic/Immunologic: Negative.   Neurological: Negative.   Hematological: Negative.   Psychiatric/Behavioral: Negative.        Objective:   Physical Exam        Assessment & Plan:

## 2013-03-24 ENCOUNTER — Institutional Professional Consult (permissible substitution): Payer: PRIVATE HEALTH INSURANCE | Admitting: Internal Medicine

## 2013-04-05 ENCOUNTER — Ambulatory Visit (INDEPENDENT_AMBULATORY_CARE_PROVIDER_SITE_OTHER): Payer: PRIVATE HEALTH INSURANCE | Admitting: Internal Medicine

## 2013-04-05 ENCOUNTER — Encounter: Payer: Self-pay | Admitting: Internal Medicine

## 2013-04-05 VITALS — BP 138/92 | HR 70 | Temp 98.0°F | Ht 69.0 in | Wt 191.0 lb

## 2013-04-05 DIAGNOSIS — R490 Dysphonia: Secondary | ICD-10-CM

## 2013-04-05 DIAGNOSIS — R0600 Dyspnea, unspecified: Secondary | ICD-10-CM

## 2013-04-05 DIAGNOSIS — I10 Essential (primary) hypertension: Secondary | ICD-10-CM

## 2013-04-05 DIAGNOSIS — R0609 Other forms of dyspnea: Secondary | ICD-10-CM

## 2013-04-05 DIAGNOSIS — F17200 Nicotine dependence, unspecified, uncomplicated: Secondary | ICD-10-CM

## 2013-04-05 NOTE — Progress Notes (Signed)
  Subjective:    Patient ID: [name], male    DOB: [birth year]  MRN: [id number]  HPI  46 yowm active smoke "always hoarse" but good exercise tolerance until around 2013 referred 04/05/2013 to pulmonary clinic for eval of abn pft's by Dr Perrin Maltese, was placed on ACEi but decided not to take it   04/05/2013 1st pulmonary eval cc doe x sprinting to 1st base x one year, noted abruptly spring 2013 but  about the same since onset, has cut down on smoking but no better ex tol, has not tried inhalers, no assoc cp  No obvious daytime variabilty or assoc chronic cough or cp or chest tightness, subjective wheeze overt sinus or hb symptoms. No unusual exp hx or h/o childhood pna/ asthma or premature birth to his knowledge.   Sleeping ok without nocturnal  or early am exacerbation  of respiratory  c/o's or need for noct saba. Also denies any obvious fluctuation of symptoms with weather or environmental changes or other aggravating or alleviating factors except as outlined above   Review of Systems  Constitutional: Negative for fever, chills, activity change, appetite change and unexpected weight change.  HENT: Negative for congestion, sore throat, rhinorrhea, sneezing, trouble swallowing, dental problem, voice change and postnasal drip.   Eyes: Negative for visual disturbance.  Respiratory: Positive for shortness of breath. Negative for cough and choking.   Cardiovascular: Negative for chest pain and leg swelling.  Gastrointestinal: Negative for nausea, vomiting and abdominal pain.  Genitourinary: Negative for difficulty urinating.  Musculoskeletal: Negative for arthralgias.  Skin: Negative for rash.  Psychiatric/Behavioral: Negative for behavioral problems and confusion.       Objective:   Physical Exam  Wt Readings from Last 3 Encounters:  04/05/13 191 lb (86.637 kg)  03/15/13 187 lb 9.6 oz (85.095 kg)  10/19/12 186 lb (84.369 kg)   severely hoarse amb wm nad unusual affect HEENT mild turbinate  edema.  Oropharynx no thrush or excess pnd or cobblestoning.  No JVD or cervical adenopathy. Mild accessory muscle hypertrophy. Trachea midline, nl thryroid. Chest was hyperinflated by percussion with diminished breath sounds and moderate increased exp time without wheeze. Hoover sign positive at mid inspiration. Regular rate and rhythm without murmur gallop or rub or increase P2 or edema.  Abd: no hsm, nl excursion. Ext warm without cyanosis or clubbing.    cxr 03/16/13 Chronic elevation of the right hemidiaphragm. No active disease  evident.        Assessment & Plan:

## 2013-04-05 NOTE — Patient Instructions (Addendum)
The key is to stop smoking completely before smoking completely stops you!   Would avoid lisinopril for your blood pressure because of it's effect on your voice and your breathing and keep working on diet and exercise  And stop smoking.   Please schedule a follow up office visit in 6 weeks, call sooner if needed

## 2013-04-06 DIAGNOSIS — R0600 Dyspnea, unspecified: Secondary | ICD-10-CM | POA: Insufficient documentation

## 2013-04-06 DIAGNOSIS — R490 Dysphonia: Secondary | ICD-10-CM | POA: Insufficient documentation

## 2013-04-06 DIAGNOSIS — I10 Essential (primary) hypertension: Secondary | ICD-10-CM | POA: Insufficient documentation

## 2013-04-06 NOTE — Assessment & Plan Note (Signed)
He does not have sign copd by spirometry 03/15/13 but does have some clinical features that are suggestive and rec full pft's in f/u.  In meantime focus should be on smoking cessation (discussed separately) on basis of the fletcher curve.  The elevated R HD is chronic, minimal  and predates radiographically the onset of his symptoms so no further w/u planned

## 2013-04-06 NOTE — Assessment & Plan Note (Addendum)
Pt had decided on his own not to take acei but does have sign hbp which he wants to treat with diet/ ex/ smoking cessation so encouraged f/u by Dr Perrin Maltese.  I did discuss the following with him:  ACE inhibitors are problematic in  pts with airway complaints because  even experienced pulmonologists can't always distinguish ace effects from copd/asthma/pnds/ allergies etc.  By themselves they don't actually cause a problem, much like oxygen can't by itself start a fire, but they certainly serve as a powerful catalyst or enhancer for any "fire"  or inflammatory process in the upper airway, be it caused by an ET  tube or more commonly reflux (especially in the obese or pts with known GERD ) or URI's, due to interference with bradykinin clearance.  The effects of acei on bradykinin levels occurs in 100% of pt's on acei (unless they surreptitiously stop the med as happened here) but the classic cough is only reported in 5%.  This leaves 95% of pts on acei's  with a variety of syndromes including no identifiable symptom in most  vs non-specific symptoms that wax and wane depending on what other insult is occuring at the level of the upper airway, which is the more common problem we see in pulmonary clinic and can't tell what's really going on s 6 week trial off  Better to avoid this class altogether if possible in pt with baseline hoarseness and doe

## 2013-04-06 NOTE — Assessment & Plan Note (Signed)
I reviewed the Flethcher curve with patient that basically indicates  if you quit smoking when your best day FEV1 is still well preserved (which his clearly is)  it is highly unlikely you will progress to severe disease and informed the patient there was no medication on the market that has proven to change the curve or the likelihood of progression.  Therefore stopping smoking and maintaining abstinence is the most important aspect of care, not choice of inhalers or for that matter, doctors.

## 2013-04-06 NOTE — Assessment & Plan Note (Signed)
States this is "all his life" though worry about smoking and acei making this worse > would avoid both if possible and ENT eval if he or fm note change in baseline

## 2013-05-06 ENCOUNTER — Telehealth: Payer: Self-pay

## 2013-05-06 NOTE — Telephone Encounter (Signed)
Pt is calling because he has lost 2 of his prescriptions that he had left for his addreall and he is going to be needing a refill in next couple of days Call back number is [phone]

## 2013-05-07 NOTE — Telephone Encounter (Signed)
Advised pt, per Dr Perrin Maltese, that he needs to come in to see Dr Perrin Maltese to discuss the Adderall before any replacements could be written. Pt agreed and stated he will be here in the morning.

## 2013-05-09 ENCOUNTER — Ambulatory Visit: Payer: Self-pay

## 2013-05-09 ENCOUNTER — Ambulatory Visit (INDEPENDENT_AMBULATORY_CARE_PROVIDER_SITE_OTHER): Payer: BC Managed Care – PPO | Admitting: Internal Medicine

## 2013-05-09 VITALS — BP 134/83 | HR 81 | Temp 98.4°F | Resp 16 | Ht 69.5 in | Wt 185.6 lb

## 2013-05-09 DIAGNOSIS — F909 Attention-deficit hyperactivity disorder, unspecified type: Secondary | ICD-10-CM

## 2013-05-09 DIAGNOSIS — Z7189 Other specified counseling: Secondary | ICD-10-CM

## 2013-05-09 DIAGNOSIS — F988 Other specified behavioral and emotional disorders with onset usually occurring in childhood and adolescence: Secondary | ICD-10-CM

## 2013-05-09 DIAGNOSIS — R509 Fever, unspecified: Secondary | ICD-10-CM

## 2013-05-09 MED ORDER — AZITHROMYCIN 250 MG PO TABS: ORAL_TABLET | ORAL | Status: DC

## 2013-05-09 MED ORDER — AMPHETAMINE-DEXTROAMPHET ER 20 MG PO CP24: 20.0000 mg | ORAL_CAPSULE | ORAL | Status: DC

## 2013-05-09 NOTE — Progress Notes (Signed)
  Subjective:    Patient ID: [name], male    DOB: [birth year], 57 y.o.   MRN: [id number]  HPI Had fever, cough fatigue yesterday, some better. Needs Add meds, database reviewed for proper use. Did lose a duel rx. Meds help a lot Work more organized and stable.   Review of Systems     Objective:   Physical Exam  Vitals reviewed. Constitutional: He is oriented to person, place, and time. He appears well-developed and well-nourished.  Eyes: EOM are normal.  Cardiovascular: Normal rate, regular rhythm and normal heart sounds.   Pulmonary/Chest: Effort normal and breath sounds normal.  Neurological: He is alert and oriented to person, place, and time. No cranial nerve deficit. He exhibits normal muscle tone. Coordination normal.  Psychiatric: He has a normal mood and affect. His behavior is normal. Judgment and thought content normal.          Assessment & Plan:  Refiil 3 months adderal

## 2013-05-09 NOTE — Progress Notes (Signed)
  Subjective:    Patient ID: [name], male    DOB: 05-06-1956, 57 y.o.   MRN: [id number]  HPI Cough fever and shortness of breath and fever yesterday. States today he feels better. Also he states he is here today to get renewals on his adderall, since he has lost 2 prescriptions.   Review of Systems     Objective:   Physical Exam        Assessment & Plan:  Zpack

## 2013-05-09 NOTE — Patient Instructions (Addendum)
Fever   Fever is a higher-than-normal body temperature. A normal temperature varies with:   Age.   How it is measured (mouth, underarm, rectal, or ear).   Time of day.  In an adult, an oral temperature around 98.6 Fahrenheit (F) or 37 Celsius (C) is considered normal. A rise in temperature of about 1.8 F or 1 C is generally considered a fever (100.4 F or 38 C). In an infant age 59 days or less, a rectal temperature of 100.4 F (38 C) generally is regarded as fever. Fever is not a disease but can be a symptom of illness.  CAUSES    Fever is most commonly caused by infection.   Some non-infectious problems can cause fever. For example:   Some arthritis problems.   Problems with the thyroid or adrenal glands.   Immune system problems.   Some kinds of cancer.   A reaction to certain medicines.   Occasionally, the source of a fever cannot be determined. This is sometimes called a "Fever of Unknown Origin" (FUO).   Some situations may lead to a temporary rise in body temperature that may go away on its own. Examples are:   Childbirth.   Surgery.   Some situations may cause a rise in body temperature but these are not considered "true fever". Examples are:   Intense exercise.   Dehydration.   Exposure to high outside or room temperatures.  SYMPTOMS    Feeling warm or hot.   Fatigue or feeling exhausted.   Aching all over.   Chills.   Shivering.   Sweats.  DIAGNOSIS   A fever can be suspected by your caregiver feeling that your skin is unusually warm. The fever is confirmed by taking a temperature with a thermometer. Temperatures can be taken different ways. Some methods are accurate and some are not:  With adults, adolescents, and children:    An oral temperature is used most commonly.   An ear thermometer will only be accurate if it is positioned as recommended by the manufacturer.   Under the arm temperatures are not accurate and not recommended.   Most electronic thermometers are fast  and accurate.  Infants and Toddlers:   Rectal temperatures are recommended and most accurate.   Ear temperatures are not accurate in this age group and are not recommended.   Skin thermometers are not accurate.  RISKS AND COMPLICATIONS    During a fever, the body uses more oxygen, so a person with a fever may develop rapid breathing or shortness of breath. This can be dangerous especially in people with heart or lung disease.   The sweats that occur following a fever can cause dehydration.   High fever can cause seizures in infants and children.   Older persons can develop confusion during a fever.  TREATMENT    Medications may be used to control temperature.   Do not give aspirin to children with fevers. There is an association with Reye's syndrome. Reye's syndrome is a rare but potentially deadly disease.   If an infection is present and medications have been prescribed, take them as directed. Finish the full course of medications until they are gone.   Sponging or bathing with room-temperature water may help reduce body temperature. Do not use ice water or alcohol sponge baths.   Do not over-bundle children in blankets or heavy clothes.   Drinking adequate fluids during an illness with fever is important to prevent dehydration.  HOME CARE INSTRUCTIONS      For adults, rest and adequate fluid intake are important. Dress according to how you feel, but do not over-bundle.   Drink enough water and/or fluids to keep your urine clear or pale yellow.   For infants over 3 months and children, giving medication as directed by your caregiver to control fever can help with comfort. The amount to be given is based on the child's weight. Do NOT give more than is recommended.  SEEK MEDICAL CARE IF:    You or your child are unable to keep fluids down.   Vomiting or diarrhea develops.   You develop a skin rash.   An oral temperature above 102 F (38.9 C) develops, or a fever which persists for over 3  days.   You develop excessive weakness, dizziness, fainting or extreme thirst.   Fevers keep coming back after 3 days.  SEEK IMMEDIATE MEDICAL CARE IF:    Shortness of breath or trouble breathing develops   You pass out.   You feel you are making little or no urine.   New pain develops that was not there before (such as in the head, neck, chest, back, or abdomen).   You cannot hold down fluids.   Vomiting and diarrhea persist for more than a day or two.   You develop a stiff neck and/or your eyes become sensitive to light.   An unexplained temperature above 102 F (38.9 C) develops.  Document Released: 11/18/2005 Document Revised: 02/10/2012 Document Reviewed: 11/03/2008  Exit[employer] Patient Information 2014 [employer].

## 2013-05-17 ENCOUNTER — Encounter: Payer: Self-pay | Admitting: Internal Medicine

## 2013-05-17 ENCOUNTER — Ambulatory Visit (INDEPENDENT_AMBULATORY_CARE_PROVIDER_SITE_OTHER): Payer: BC Managed Care – PPO | Admitting: Internal Medicine

## 2013-05-17 VITALS — BP 130/84 | HR 84 | Temp 97.4°F | Ht 68.5 in | Wt 187.6 lb

## 2013-05-17 DIAGNOSIS — R059 Cough, unspecified: Secondary | ICD-10-CM | POA: Insufficient documentation

## 2013-05-17 DIAGNOSIS — I10 Essential (primary) hypertension: Secondary | ICD-10-CM

## 2013-05-17 DIAGNOSIS — R0989 Other specified symptoms and signs involving the circulatory and respiratory systems: Secondary | ICD-10-CM

## 2013-05-17 DIAGNOSIS — R05 Cough: Secondary | ICD-10-CM | POA: Insufficient documentation

## 2013-05-17 DIAGNOSIS — R0600 Dyspnea, unspecified: Secondary | ICD-10-CM

## 2013-05-17 MED ORDER — FAMOTIDINE 20 MG PO TABS: ORAL_TABLET | ORAL | Status: DC

## 2013-05-17 MED ORDER — PANTOPRAZOLE SODIUM 40 MG PO TBEC: 40.0000 mg | DELAYED_RELEASE_TABLET | Freq: Every day | ORAL | Status: DC

## 2013-05-17 NOTE — Patient Instructions (Addendum)
Pantoprazole (protonix) 40 mg   Take 30-60 min before first meal of the day and Pepcid 20 mg one bedtime until return to office - this is the only way to tell what role if any the acid is having on your voice, cough, breathing   GERD (REFLUX)  is an extremely common cause of respiratory symptoms just like yours  -  many times with no significant heartburn at all.    It can be treated with medication, but also with lifestyle changes including avoidance of late meals, excessive alcohol, smoking cessation, and avoid fatty foods, chocolate, peppermint, colas, red wine, and acidic juices such as orange juice.  NO MINT OR MENTHOL PRODUCTS SO NO COUGH DROPS  USE SUGARLESS CANDY INSTEAD (jolley ranchers or Stover's)  NO OIL BASED VITAMINS - use powdered substitutes - stop fish oil   Please remember to go to the  Lab  department downstairs for your tests - we will call you with the results when they are available.     Please schedule a follow up office visit in 4 weeks, sooner if needed with pfts

## 2013-05-17 NOTE — Progress Notes (Signed)
  Subjective:    Patient ID: [name], male    DOB: 1956-10-20  MRN: [id number]   Brief patient profile:  37 yowm occasional smoking  "always hoarse" but good exercise tolerance until around 2013 referred 04/05/2013 to pulmonary clinic for eval of abn pft's by Dr Perrin Maltese.    04/05/2013 1st pulmonary eval cc doe x sprinting to [employer] x one year, noted abruptly spring 2013 but  about the same since onset, has cut down on smoking but no better ex tol, has not tried inhalers, no assoc cp rec No change rx.    05/17/2013 f/u ov/Cyanna Neace  Chief Complaint  Patient presents with  . Shortness of Breath    pt stetes breathing is better,has a small chest cold,productive cough.would like to discuss allergies.    variable sob, still very hoarse with upper airway pattern cough but not able to run from 1st to 3rd s problem so he describes himself overall as better.   No obvious daytime variabilty or assoc   cp or chest tightness, subjective wheeze overt sinus or hb symptoms. No unusual exp hx or h/o childhood pna/ asthma or premature birth to his knowledge.   Sleeping ok without nocturnal  or early am exacerbation  of respiratory  c/o's or need for noct saba. Also denies any obvious fluctuation of symptoms with weather or environmental changes or other aggravating or alleviating factors except as outlined above.  Current Medications, Allergies, Past Medical History, Past Surgical History, Family History, and Social History were reviewed in [employer] record.  ROS  The following are not active complaints unless bolded sore throat, dysphagia, dental problems, itching, sneezing,  nasal congestion or excess/ purulent secretions, ear ache,   fever, chills, sweats, unintended wt loss, pleuritic or exertional cp, hemoptysis,  orthopnea pnd or leg swelling, presyncope, palpitations, heartburn, abdominal pain, anorexia, nausea, vomiting, diarrhea  or change in bowel or urinary habits, change  in stools or urine, dysuria,hematuria,  rash, arthralgias, visual complaints, headache, numbness weakness or ataxia or problems with walking or coordination,  change in mood/affect or memory.             Objective:   Physical Exam  05/17/2013      187  Wt Readings from Last 3 Encounters:  04/05/13 191 lb (86.637 kg)  03/15/13 187 lb 9.6 oz (85.095 kg)  10/19/12 186 lb (84.369 kg)   mod hoarse amb wm nad unusual affect HEENT mild turbinate edema.  Oropharynx no thrush or excess pnd or cobblestoning.  No JVD or cervical adenopathy. Mild accessory muscle hypertrophy. Trachea midline, nl thryroid. Chest was hyperinflated by percussion with diminished breath sounds and moderate increased exp time without wheeze. Hoover sign positive at mid inspiration. Regular rate and rhythm without murmur gallop or rub or increase P2 or edema.  Abd: no hsm, nl excursion. Ext warm without cyanosis or clubbing.    cxr 03/16/13 Chronic elevation of the right hemidiaphragm. No active disease  evident.        Assessment & Plan:

## 2013-05-18 NOTE — Assessment & Plan Note (Signed)
Improved but assoc with lots of chronic upper airway symptoms typical of  Classic Upper airway cough syndrome, so named because it's frequently impossible to sort out how much is  CR/sinusitis with freq throat clearing (which can be related to primary GERD)   vs  causing  secondary (" extra esophageal")  GERD from wide swings in gastric pressure that occur with throat clearing, often  promoting self use of mint and menthol lozenges that reduce the lower esophageal sphincter tone and exacerbate the problem further in a cyclical fashion.   These are the same pts (now being labeled as having "irritable larynx syndrome" by some cough centers) who not infrequently have a history of having failed to tolerate ace inhibitors,  dry powder inhalers or biphosphonates or report having atypical reflux symptoms that don't respond to standard doses of PPI , and are easily confused as having aecopd or asthma flares by even experienced allergists/ pulmonologists.  For now rx max gerd rx and w/u for possible rhinitis with allergy testing then regroup in 4 weeks with pfts

## 2013-05-18 NOTE — Assessment & Plan Note (Signed)
Adequate control on present rx, reviewed need for diet/ ex, no bp meds for now

## 2013-06-15 ENCOUNTER — Ambulatory Visit (INDEPENDENT_AMBULATORY_CARE_PROVIDER_SITE_OTHER): Payer: BC Managed Care – PPO | Admitting: Internal Medicine

## 2013-06-15 ENCOUNTER — Encounter: Payer: Self-pay | Admitting: Internal Medicine

## 2013-06-15 VITALS — BP 146/100 | HR 80 | Temp 97.1°F | Ht 68.25 in | Wt 190.0 lb

## 2013-06-15 DIAGNOSIS — I10 Essential (primary) hypertension: Secondary | ICD-10-CM

## 2013-06-15 DIAGNOSIS — R0989 Other specified symptoms and signs involving the circulatory and respiratory systems: Secondary | ICD-10-CM

## 2013-06-15 DIAGNOSIS — R05 Cough: Secondary | ICD-10-CM

## 2013-06-15 DIAGNOSIS — R0600 Dyspnea, unspecified: Secondary | ICD-10-CM

## 2013-06-15 DIAGNOSIS — R490 Dysphonia: Secondary | ICD-10-CM

## 2013-06-15 LAB — PULMONARY FUNCTION TEST

## 2013-06-15 NOTE — Patient Instructions (Addendum)
Please see patient coordinator before you leave today  to schedule ENT eval   Avoid salt and monitor your blood pressure weekly if possible and follow up with Dr Perrin Maltese  In meantime continue Pantoprazole (protonix) 40 mg   Take 30-60 min before first meal of the day and Pepcid 20 mg one bedtime until return to office - this is the best way to tell whether stomach acid is contributing to your problem.    Stay on GERD (REFLUX)  is an extremely common cause of respiratory symptoms, many times with no significant heartburn at all.    It can be treated with medication, but also with lifestyle changes including avoidance of late meals, excessive alcohol, smoking cessation, and avoid fatty foods, chocolate, peppermint, colas, red wine, and acidic juices such as orange juice.  NO MINT OR MENTHOL PRODUCTS SO NO COUGH DROPS  USE SUGARLESS CANDY INSTEAD (jolley ranchers or Stover's)  NO OIL BASED VITAMINS - use powdered substitutes.

## 2013-06-15 NOTE — Progress Notes (Signed)
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    05/17/2013 f/u ov/Lizza Huffaker  Chief Complaint  Patient presents with  . Shortness of Breath    pt stetes breathing is better,has a small chest cold,productive cough.would like to discuss allergies.    variable sob, still very hoarse with upper airway pattern cough but not able to run from 1st to 3rd s problem so he describes himself overall as better.   06/15/2013 f/u ov / Sherene Sires /    Chief Complaint  Patient presents with  . Follow-up    Pt states cough comes and goes- worse with humid weather. Breathing slightly improved since his last visit   Activities desired are not limited by breathing, cough is more throat clearing no excess mucus, no worse in ams   No obvious daytime variabilty or assoc   cp or chest tightness, subjective wheeze overt sinus or hb symptoms. No unusual exp hx or h/o childhood pna/ asthma or premature birth to his knowledge.   Sleeping ok without nocturnal  or early am exacerbation  of respiratory  c/o's or need for noct saba. Also denies any obvious fluctuation of symptoms with weather or environmental changes or other aggravating or alleviating factors except as outlined above.  Current Medications, Allergies, Past Medical History, Past Surgical History, Family History, and Social History were reviewed in [employer] record.  ROS  The following are not active complaints unless bolded sore throat, dysphagia, dental  problems, itching, sneezing,  nasal congestion or excess/ purulent secretions, ear ache,   fever, chills, sweats, unintended wt loss, pleuritic or exertional cp, hemoptysis,  orthopnea pnd or leg swelling, presyncope, palpitations, heartburn, abdominal pain, anorexia, nausea, vomiting, diarrhea  or change in bowel or urinary habits, change in stools or urine, dysuria,hematuria,  rash, arthralgias, visual complaints, headache, numbness weakness or ataxia or problems with walking or coordination,  change in mood/affect or memory.             Objective:   Physical Exam  05/17/2013      187  > 190 06/15/2013  Wt Readings from Last 3 Encounters:  04/05/13 191 lb (86.637 kg)  03/15/13 187 lb 9.6 oz (85.095 kg)  10/19/12 186 lb (84.369 kg)   mod hoarse amb wm nad unusual affect with violent throat clearing intermittently "that's nl for me"  HEENT: nl dentition, turbinates, and orophanx. Nl external ear canals without cough reflex   NECK :  without JVD/Nodes/TM/ nl carotid upstrokes bilaterally   LUNGS: no acc muscle use, clear to A and P bilaterally without cough on insp or exp maneuvers   CV:  RRR  no s3 or murmur or increase in P2, no edema   ABD:  soft and nontender with nl excursion in the supine position. No bruits or organomegaly, bowel sounds nl  MS:  warm without deformities, calf tenderness, cyanosis or clubbing  SKIN: warm and dry without lesions  NEURO:  alert, approp, no deficits  .    cxr 03/16/13 Chronic elevation of the right hemidiaphragm. No active disease  evident.        Assessment & Plan:

## 2013-06-15 NOTE — Progress Notes (Signed)
PFT done today. 

## 2013-06-15 NOTE — Assessment & Plan Note (Signed)
Classic Upper airway cough syndrome, so named because it's frequently impossible to sort out how much is  CR/sinusitis with freq throat clearing (which can be related to primary GERD)   vs  causing  secondary (" extra esophageal")  GERD from wide swings in gastric pressure that occur with throat clearing, often  promoting self use of mint and menthol lozenges that reduce the lower esophageal sphincter tone and exacerbate the problem further in a cyclical fashion.   These are the same pts (now being labeled as having "irritable larynx syndrome" by some cough centers) who not infrequently have a history of having failed to tolerate ace inhibitors,  dry powder inhalers or biphosphonates or report having atypical reflux symptoms that don't respond to standard doses of PPI , and are easily confused as having aecopd or asthma flares by even experienced allergists/ pulmonologists.  No better with gerd rx assoc with hoarsness > ent eval next

## 2013-06-17 NOTE — Assessment & Plan Note (Signed)
Should consider rx if persists on low salt diet but would strongly advise avoid acei in setting of upper airway cough syndrome at baseline

## 2013-06-17 NOTE — Assessment & Plan Note (Signed)
Referred to ent

## 2013-06-17 NOTE — Assessment & Plan Note (Addendum)
-   pft's 06/15/2013 FEV1  2.62 (79%) ratio 81 and no change p B2,  DLCO 87% with variable insp truncation   The only abn on pft's is variable insp truncation classically seen in vcd, not a mechanical obst (which would give more of a fixed pattern) but nevertheless  ent eval is next step> referred

## 2013-07-05 ENCOUNTER — Encounter: Payer: Self-pay | Admitting: Internal Medicine

## 2013-08-26 ENCOUNTER — Telehealth: Payer: Self-pay

## 2013-08-26 NOTE — Telephone Encounter (Signed)
Typically, [facility] sends [place] a formal request for clearance.  Please call their office.  Dr. Perrin Maltese is out of the office and returns 09/05/2013.  If the surgery is planned prior to that, he'll need to come in for evaluation with another provider to assess his risks for surgery. If it's after 10/05, Dr. Perrin Maltese may be willing to sign clearance without an additional visit, or advise other evaluation required prior to surgery.

## 2013-08-26 NOTE — Telephone Encounter (Signed)
Pt states that [facility] would like for [place] to fax them surgical clearance for pt to have shoulder surgery performed. Best# [phone]

## 2013-08-26 NOTE — Telephone Encounter (Signed)
I think patient will need office visit for medical clearance, Dr Perrin Maltese out of office, please advise.

## 2013-08-27 NOTE — Telephone Encounter (Signed)
Will clear for surgery, had recent exam.

## 2013-08-27 NOTE — Telephone Encounter (Signed)
When form comes from [facility], please give to me.

## 2013-08-27 NOTE — Telephone Encounter (Signed)
Patient advised Dr Perrin Maltese out of the office until 09/05/13. He states he does not want to come in the office for clearance, he wants to know if Dr Perrin Maltese can clear him without visit. He is aware it will be after Oct 5th. Please advise

## 2013-08-27 NOTE — Telephone Encounter (Signed)
We have the form. Will put on your desk

## 2013-09-06 ENCOUNTER — Telehealth: Payer: Self-pay

## 2013-09-06 NOTE — Telephone Encounter (Signed)
Guest - Pt wants to know if he can get a surgical release from Dr. Perrin Maltese.  He is to have surgery on his shoulder and was in recently to see him.  Says if we can't just give him one he will come in for an OV.  If we call him today and can't get him, please leave a message because he has jury duty.  [phone]

## 2013-09-07 ENCOUNTER — Telehealth: Payer: Self-pay

## 2013-09-07 DIAGNOSIS — Z7189 Other specified counseling: Secondary | ICD-10-CM

## 2013-09-07 DIAGNOSIS — R509 Fever, unspecified: Secondary | ICD-10-CM

## 2013-09-07 DIAGNOSIS — F909 Attention-deficit hyperactivity disorder, unspecified type: Secondary | ICD-10-CM

## 2013-09-07 NOTE — Telephone Encounter (Signed)
He is due for follow up for the Adderall in December, pended refill please advise He needs the medical clearance form filled out for Dr Ranell Patrick. Form was sent to [place], need to know where it went.

## 2013-09-07 NOTE — Telephone Encounter (Signed)
Dr Perrin Maltese will clear, the clearance form was to be sent to me, do you still have it? I do not, I need it. It is from [facility].

## 2013-09-07 NOTE — Telephone Encounter (Signed)
Pt of Dr.Guest. Needs a referral to see a Dr.Morris about shoulder surgery. The surgery needs to be done before the end of the year.  Also needs an adderall refill, does he need to be seen for that? Call at [phone].

## 2013-09-08 ENCOUNTER — Telehealth: Payer: Self-pay

## 2013-09-08 NOTE — Telephone Encounter (Signed)
Please advise 

## 2013-09-08 NOTE — Telephone Encounter (Signed)
Thank you, I have gotten this now and will have Dr Perrin Maltese sign.

## 2013-09-08 NOTE — Telephone Encounter (Signed)
The first copy came a few weeks ago and sent to provider. Second copy received. Will forward to your desk.

## 2013-09-08 NOTE — Telephone Encounter (Signed)
Patient needs his adderal refilled.  He can come in tomorrow to see Dr. Perrin Maltese - if needed - just call  [phone]

## 2013-09-09 ENCOUNTER — Other Ambulatory Visit: Payer: Self-pay | Admitting: Radiology

## 2013-09-09 DIAGNOSIS — R509 Fever, unspecified: Secondary | ICD-10-CM

## 2013-09-09 DIAGNOSIS — F909 Attention-deficit hyperactivity disorder, unspecified type: Secondary | ICD-10-CM

## 2013-09-09 DIAGNOSIS — Z7189 Other specified counseling: Secondary | ICD-10-CM

## 2013-09-09 MED ORDER — AMPHETAMINE-DEXTROAMPHET ER 20 MG PO CP24: 20.0000 mg | ORAL_CAPSULE | ORAL | Status: DC

## 2013-09-28 ENCOUNTER — Encounter (HOSPITAL_COMMUNITY): Payer: Self-pay | Admitting: Pharmacy Technician

## 2013-09-28 NOTE — H&P (Signed)
  [name] is an 57 y.o. male.    Chief Complaint: right shoulder pain  HPI: Pt is a 57 y.o. male complaining of right shoulder pain for multiple years. Pain had continually increased since the beginning. X-rays in the clinic show end-stage arthritic changes of the right shoulder. Pt has tried various conservative treatments which have failed to alleviate their symptoms, including therapy and injections. Various options are discussed with the patient. Risks, benefits and expectations were discussed with the patient. Patient understand the risks, benefits and expectations and wishes to proceed with surgery.   PCP:  Tally Due, MD  D/C Plans:  Home with HHPT  PMH: Past Medical History  Diagnosis Date  . ADHD (attention deficit hyperactivity disorder)   . Anxiety   . Smoker     PSH: No past surgical history on file.  Social History:  reports that he quit smoking about 2 years ago. His smoking use included Cigarettes. He has a 3 pack-year smoking history. He has never used smokeless tobacco. He reports that he drinks about 9.0 ounces of alcohol per week. He reports that he does not use illicit drugs.  Allergies:  No Known Allergies  Medications: Current Facility-Administered Medications  Medication Dose Route Frequency Provider Last Rate Last Dose  . 0.9 %  sodium chloride infusion  500 mL Intravenous Continuous Mardella Layman, MD       Current Outpatient Prescriptions  Medication Sig Dispense Refill  . amphetamine-dextroamphetamine (ADDERALL XR) 20 MG 24 hr capsule Take 1 capsule (20 mg total) by mouth every morning.  30 capsule  0  . aspirin 81 MG tablet Take 81 mg by mouth every other day.      . famotidine (PEPCID) 20 MG tablet Take 20 mg by mouth at bedtime.      . tadalafil (CIALIS) 20 MG tablet Take 1 tablet (20 mg total) by mouth daily as needed for erectile dysfunction.  10 tablet  3  . zolpidem (AMBIEN) 10 MG tablet Take 1 tablet (10 mg total) by mouth at  bedtime as needed for sleep.  90 tablet  3    No results found for this or any previous visit (from the past 48 hour(s)). No results found.  ROS: Pain with rom of the right upper extremity  Physical Exam: Alert and oriented 57 y.o. male in no acute distress Cranial nerves 2-12 intact Cervical spine: full rom with no tenderness, nv intact distally Chest: active breath sounds bilaterally, no wheeze rhonchi or rales Heart: regular rate and rhythm, no murmur Abd: non tender non distended with active bowel sounds Hip is stable with rom  Moderate pain and restricted rom in right upper extremity Mild crepitus with rom nv intact distally No rashes or edema  Assessment/Plan Assessment: right shoulder end stage osteoarthritis  Plan: Patient will undergo a right total shoulder by Dr. Ranell Patrick at [facility]. Risks benefits and expectations were discussed with the patient. Patient understand risks, benefits and expectations and wishes to proceed.

## 2013-09-29 ENCOUNTER — Encounter (HOSPITAL_COMMUNITY): Payer: Self-pay

## 2013-09-29 ENCOUNTER — Telehealth: Payer: Self-pay | Admitting: Radiology

## 2013-09-29 ENCOUNTER — Encounter (HOSPITAL_COMMUNITY)
Admission: RE | Admit: 2013-09-29 | Discharge: 2013-09-29 | Disposition: A | Discharge status: Home / Self Care | Payer: BC Managed Care – PPO | Source: Ambulatory Visit | Attending: Orthopedic Surgery | Admitting: Orthopedic Surgery

## 2013-09-29 DIAGNOSIS — Z01818 Encounter for other preprocedural examination: Secondary | ICD-10-CM | POA: Insufficient documentation

## 2013-09-29 DIAGNOSIS — Z01812 Encounter for preprocedural laboratory examination: Secondary | ICD-10-CM | POA: Insufficient documentation

## 2013-09-29 HISTORY — DX: Insomnia, unspecified: G47.00

## 2013-09-29 HISTORY — DX: Gastro-esophageal reflux disease without esophagitis: K21.9

## 2013-09-29 LAB — CBC
HCT: 44.7 % (ref 39.0–52.0)
Hemoglobin: 15.8 g/dL (ref 13.0–17.0)
MCH: 31.5 pg (ref 26.0–34.0)
MCV: 89 fL (ref 78.0–100.0)
RBC: 5.02 MIL/uL (ref 4.22–5.81)
RDW: 13.2 % (ref 11.5–15.5)

## 2013-09-29 LAB — BASIC METABOLIC PANEL
BUN: 21 mg/dL (ref 6–23)
CO2: 26 mEq/L (ref 19–32)
Chloride: 98 mEq/L (ref 96–112)
Creatinine, Ser: 0.97 mg/dL (ref 0.50–1.35)
GFR calc Af Amer: >90 mL/min (ref >90)
Glucose, Bld: 111 mg/dL — ABNORMAL HIGH (ref 70–99)
Potassium: 4.2 mEq/L (ref 3.5–5.1)
Sodium: 136 mEq/L (ref 135–145)

## 2013-09-29 NOTE — Pre-Procedure Instructions (Signed)
[name]  09/29/2013   Your procedure is scheduled on:  FRIDAY, November 7th   Report to [facility]  2 * 3 at 9:00 AM.   Call this number if you have problems the morning of surgery: [phone]   Remember:   Do not eat food or drink liquids after midnight Thursday.   Take these medicines the morning of surgery with A SIP OF WATER: Adderal, Famotidine   Do not wear jewelry--no rings or watches.  Do not wear lotions or colognes. You may wear deodorant.   Men may shave face and neck.   Do not bring valuables to the hospital.  [facility] is not responsible for any belongings or valuables.               Contacts, dentures or bridgework may not be worn into surgery.  Leave suitcase in the car. After surgery it may be brought to your room.  For patients admitted to the hospital, discharge time is determined by your treatment team.           Name and phone number of your driver:    Special Instructions: Shower using CHG 2 nights before surgery and the night before surgery.  If you shower the day of surgery use CHG.  Use special wash - you have one bottle of CHG for all showers.  You should use approximately 1/3 of the bottle for each shower.   Please read over the following fact sheets that you were given: Pain Booklet, Coughing and Deep Breathing, Blood Transfusion Information, MRSA Information and Surgical Site Infection Prevention

## 2013-09-29 NOTE — Progress Notes (Addendum)
Patient has politely declined wearing the blue blood band for a week d/t his job requirements.  He understands another sample will be drawn dos   DA  Lab results are showing a elevated WBC of 24.7--I called notification to Dr. Ranell Patrick (who was back in the OR)..DA   Spoke with patient regarding his white cell ct.  He states he feels fine, not taking steroid, no cough or cold ..so, i am calling [facility].  Dr. Perrin Maltese doesn't see many patients any more,  so I am trying to get the "next available representative" and see if patient can go see him, per Dr. Ranell Patrick' request.  I have spoken with [facility].  She said patient can come in at 8 am and any of the other providers would be glad to see him and that afterwards, they would relay info on to Dr. Ranell Patrick.  I have left a voice mail for [name] informing him of the recent events.  DA

## 2013-09-29 NOTE — Telephone Encounter (Signed)
Pre op called patient has White count of 24, he is not sick. He is scheduled for surgery, they need [place] to evaluate the elevated white count, patient will come in tomorrow for this. He will be here at 8am. To you FYI , he is your patient. I have advised he can come in and be evaluated by any of our providers.

## 2013-09-30 ENCOUNTER — Ambulatory Visit (INDEPENDENT_AMBULATORY_CARE_PROVIDER_SITE_OTHER): Payer: BC Managed Care – PPO | Admitting: Internal Medicine

## 2013-09-30 VITALS — BP 120/82 | HR 77 | Temp 98.4°F | Resp 16 | Ht 68.0 in | Wt 189.4 lb

## 2013-09-30 DIAGNOSIS — L0233 Carbuncle of buttock: Secondary | ICD-10-CM

## 2013-09-30 DIAGNOSIS — L0232 Furuncle of buttock: Secondary | ICD-10-CM

## 2013-09-30 DIAGNOSIS — D72829 Elevated white blood cell count, unspecified: Secondary | ICD-10-CM

## 2013-09-30 LAB — POCT CBC
HCT, POC: 47.8 % (ref 43.5–53.7)
Lymph, poc: 3.8 — AB (ref 0.6–3.4)
MCHC: 32.4 g/dL (ref 31.8–35.4)
MID (cbc): 0.7 (ref 0–0.9)
POC Granulocyte: 7.4 — AB (ref 2–6.9)
POC LYMPH PERCENT: 31.8 %L (ref 10–50)
POC MID %: 5.9 %M (ref 0–12)
Platelet Count, POC: 201 10*3/uL (ref 142–424)
RBC: 5.03 M/uL (ref 4.69–6.13)
RDW, POC: 13.8 %

## 2013-09-30 MED ORDER — MUPIROCIN 2 % EX OINT: TOPICAL_OINTMENT | Freq: Three times a day (TID) | CUTANEOUS | Status: DC

## 2013-09-30 MED ORDER — DOXYCYCLINE HYCLATE 100 MG PO TABS: 100.0000 mg | ORAL_TABLET | Freq: Two times a day (BID) | ORAL | Status: DC

## 2013-09-30 NOTE — Progress Notes (Addendum)
Anesthesia Chart Review:  Patient is a 57 year old male scheduled for right total shoulder arthroplasty on 10/08/13 by Dr. Ranell Patrick.  History includes former smoker, ADHD, GERD, insomnia, throat surgery, wisdom teeth extraction.  He was evaluated earlier this year (06/2013) by pulmonologist Dr. Sherene Sires and ENT Dr. Jearld Fenton for hoarseness.  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 PCP is Dr. Robert Bellow who had previously cleared patient, but is actually re-evaluating patient today for leukocytosis noted in his preoperative labs. WBC was down to 11.9K day but exam revealed a furuncle of the buttocks, and he was started on mupirocin and a ten day course of doxycycline.  (His   EKG on 03/17/13 showed SR with first degree AVB.  CXR on 03/15/13 showed chronic elevation of the right hemidiaphragm.  No active disease.  PFTs on 06/15/2013 showed FEV1 2.62 (79%) ratio 81 and no change p B2, DLCO 87% with variable insp truncation (Dr. Sandrea Hughs).   Labs from 09/29/13 showed an elevated WBC of 24.7K, but it was rechecked today and was down to 11.9K.    I have updated Clydie Braun at Dr. Ranell Patrick' office.  She will have Dr. Ranell Patrick and/or his PA Brad review notes in Epic and make a final determination regarding whether to proceed or postpone surgery.  Since this is a total joint they may opt to delay until he has completed his full coarse of antibiotics. (Update: Received a voicemail from [place].  Dr. Ranell Patrick has reviewed notes and spoken with patient.  Currently, the plan is to proceed as scheduled.)  Velna Ochs [facility] Stay [facility]/Anesthesiology Phone [phone] 09/30/2013 2:37 PM

## 2013-09-30 NOTE — Patient Instructions (Signed)
Staphylococcal Infections  Staphylococcus aureus (Staph) is a germ that may cause infections, especially on broken skin or wounds. Methicillin is a drug sometimes used to treat Staph infections. If the germ is resistant to methicillin, it is called MRSA. Methicillin and some other drugs may not work to treat the infection. However, there are other antibiotic drugs that may be used that will treat the infection. Your caregiver may do a culture from your wound, skin, or other site. This will tell him/her that you have MRSA present. Sometimes healthy people carry MRSA, but it may also cause an infection.  Staph infections, including MRSA can spread from one person to another by contact with an infected person. You may prevent spreading an MRSA infection to those you live with or others around you by following these steps:  Keep infections and pus or drainage material covered with clean dry bandages. Follow your caregiver's instructions on proper care of the wound. Pus from infected wounds can contain MRSA and spread the germ (bacteria) to others.  Advise your family and other close contacts to wash their hands frequently with soap and warm water. This should be done especially if they change your bandages or touch the infected wound or infectious materials.  Avoid sharing personal items (towels, washcloth, razor, clothing, or uniforms) that may have had contact with the infected wound.  Wash linens and clothes that become soiled, with hot water and laundry detergent. Drying clothes in a hot dryer, rather than air-drying, also helps kill bacteria in clothes.  Tell any healthcare providers who treat you that you have an MRSA infection. In the hospital steps will be taken to prevent the spread of MRSA.  Ask your caregiver about return to school or return to work if you have a Staph or MRSA infection.  If your caregiver has given you a follow-up appointment, it is very important to keep that appointment.  Not keeping the appointment could result in a chronic or permanent injury, pain, and disability. If there is any problem keeping the appointment, you must call back to this facility for assistance. Staph and MRSA infections can become very serious and you should contact your caregiver if your infection gets worse. To fight the infection, follow your doctor's instructions for wound care and take all medicines as prescribed. SEEK MEDICAL CARE IF:   You have increased pus coming from the wound.  You have a fever.  You notice a bad smell coming from the wound or dressing. SEEK IMMEDIATE MEDICAL CARE IF:  You have redness, red streaks, swelling, or increasing pain in the wound. Document Released: 02/08/2003 Document Revised: 02/10/2012 Document Reviewed: 07/04/2008 [facility] Patient Information 2014 [place], [place]. MRSA Overview MRSA stands for methicillin-resistant Staphylococcus aureus. It is a type of bacteria that is resistant to some common antibiotics. It can cause infections in the skin and many other places in the body. Staphylococcus aureus, often called "staph," is a bacteria that normally lives on the skin or in the nose. Staph on the surface of the skin or in the nose does not cause problems. However, if the staph enters the body through a cut, wound, or break in the skin, an infection can happen. Up until recently, infections with the MRSA type of staph mainly occurred in hospitals and other healthcare settings. There are now increasing problems with MRSA infections in the community as well. Infections with MRSA may be very serious or even life-threatening. Most MRSA infections are acquired in one of two ways:  Healthcare-associated  MRSA (HA-MRSA)  This can be acquired by people in any healthcare setting. MRSA can be a big problem for hospitalized people, people in nursing homes, people in rehabilitation facilities, people with weakened immune systems, dialysis patients, and those who have  had surgery.  Community-associated MRSA (CA-MRSA)  Community spread of MRSA is becoming more common. It is known to spread in crowded settings, in jails and prisons, and in situations where there is close skin-to-skin contact, such as during sporting events or in locker rooms. MRSA can be spread through shared items, such as children's toys, razors, towels, or sports equipment. CAUSES  All staph, including MRSA, are normally harmless unless they enter the body through a scratch, cut, or wound, such as with surgery. All staph, including MRSA, can be spread from person-to-person by touching contaminated objects or through direct contact. SPECIAL GROUPS MRSA can present problems for special groups of people. Some of these groups include:  Breastfeeding women.  The most common problem is MRSA infection of the breast (mastitis). There is evidence that MRSA can be passed to an infant from infected breast milk. Your caregiver may recommend that you stop breastfeeding until the mastitis is under control.  If you are breastfeeding and have a MRSA infection in a place other than the breast, you may usually continue breastfeeding while under treatment. If taking antibiotics, ask your caregiver if it is safe to continue breastfeeding while taking your prescribed medicines.  Neonates (babies from birth to 60 month old) and infants (babies from 46 month to 24 year old).  There is evidence that MRSA can be passed to a newborn at birth if the mother has MRSA on the skin, in or around the birth canal, or an infection in the uterus, cervix, or vagina. MRSA infection can have the same appearance as a normal newborn or infant rash or several other skin infections. This can make it hard to diagnose MRSA.  Immune compromised people.  If you have an immune system problem, you may have a higher chance of developing a MRSA infection.  People after any type of surgery.  Staph in general, including MRSA, is the most  common cause of infections occurring at the site of recent surgery.  People on long-term steroid medicines.  These kinds of medicines can lower your resistance to infection. This can increase your chance of getting MRSA.  People who have had frequent hospitalizations, live in nursing homes or other residential care facilities, have venous or urinary catheters, or have taken multiple courses of antibiotic therapy for any reason. DIAGNOSIS  Diagnosis of MRSA is done by cultures of fluid samples that may come from:  Swabs taken from cuts or wounds in infected areas.  Nasal swabs.  Saliva or deep cough specimens from the lungs (sputum).  Urine.  Blood. Many people are "colonized" with MRSA but have no signs of infection. This means that people carry the MRSA germ on their skin or in their nose and may never develop MRSA infection.  TREATMENT  Treatment varies and is based on how serious, how deep, or how extensive the infection is. For example:  Some skin infections, such as a small boil or abscess, may be treated by draining yellowish-white fluid (pus) from the site of the infection.  Deeper or more widespread soft tissue infections are usually treated with surgery to drain pus and with antibiotic medicine given by vein or by mouth. This may be recommended even if you are pregnant.  Serious infections may require  a hospital stay. If antibiotics are given, they may be needed for several weeks. PREVENTION  Because many people are colonized with staph, including MRSA, preventing the spread of the bacteria from person-to-person is most important. The best way to prevent the spread of bacteria and other germs is through proper hand washing or by using alcohol-based hand disinfectants. The following are other ways to help prevent MRSA infection within the hospital and community settings.   Healthcare settings:  Strict hand washing or hand disinfection procedures need to be followed before  and after touching every patient.  Patients infected with MRSA are placed in isolation to prevent the spread of the bacteria.  Healthcare workers need to wear disposable gowns and gloves when touching or caring for patients infected with MRSA. Visitors may also be asked to wear a gown and gloves.  Hospital surfaces need to be disinfected frequently.  Community settings:  NIKE frequently with soap and water for at least 15 seconds. Otherwise, use alcohol-based hand disinfectants when soap and water is not available.  Make sure people who live with you wash their hands often, too.  Do not share personal items. For example, avoid sharing razors and other personal hygiene items, towels, clothing, and athletic equipment.  Wash and dry your clothes and bedding at the warmest temperatures recommended on the labels.  Keep wounds covered. Pus from infected sores may contain MRSA and other bacteria. Keep cuts and abrasions clean and covered with germ-free (sterile), dry bandages until they are healed.  If you have a wound that appears infected, ask your caregiver if a culture for MRSA and other bacteria should be done.  If you are breastfeeding, talk to your caregiver about MRSA. You may be asked to temporarily stop breastfeeding. HOME CARE INSTRUCTIONS   Take your antibiotics as directed. Finish them even if you start to feel better.  Avoid close contact with those around you as much as possible. Do not use towels, razors, toothbrushes, bedding, or other items that will be used by others.  To fight the infection, follow your caregiver's instructions for wound care. Wash your hands before and after changing your bandages.  If you have an intravascular device, such as a catheter, make sure you know how to care for it.  Be sure to tell any healthcare providers that you have MRSA so they are aware of your infection. SEEK IMMEDIATE MEDICAL CARE IF:   The infection appears to be  getting worse. Signs include:  Increased warmth, redness, or tenderness around the wound site.  A red line that extends from the infection site.  A dark color in the area around the infection.  Wound drainage that is tan, yellow, or green.  A bad smell coming from the wound.  You feel sick to your stomach (nauseous) and throw up (vomit) or cannot keep medicine down.  You have a fever.  Your baby is older than 3 months with a rectal temperature of 102 F (38.9 C) or higher.  Your baby is 32 months old or younger with a rectal temperature of 100.4 F (38 C) or higher.  You have difficulty breathing. MAKE SURE YOU:   Understand these instructions.  Will watch your condition.  Will get help right away if you are not doing well or get worse. Document Released: 11/18/2005 Document Revised: 02/10/2012 Document Reviewed: 02/20/2011 [facility] Patient Information 2014 [place], [place].

## 2013-09-30 NOTE — Progress Notes (Signed)
  Subjective:    Patient ID: [name], male    DOB: 26-Oct-1956, 57 y.o.   MRN: [id number]  HPI [name] presents today with an elevated WBC preoperatively. He is scheduled for 10/08/13 for his right shoulder. Pt states he feels fine with no fever hx. He states he possibly has an ingrown hair on his right buttock which has been causing him pain for a couple months. He states his wife tried to burst it last night which may have made it worse.    Feels fine.  Review of Systems     Objective:   Physical Exam  Vitals reviewed. Constitutional: He is oriented to person, place, and time. He appears well-developed and well-nourished. No distress.  HENT:  Head: Normocephalic.  Eyes: EOM are normal.  Neck: Normal range of motion.  Cardiovascular: Normal rate.   Pulmonary/Chest: Effort normal.  Neurological: He is alert and oriented to person, place, and time. No cranial nerve deficit. He exhibits normal muscle tone. Coordination normal.  Skin: Rash noted. Rash is maculopapular and pustular. There is erythema.     Lesion is healing  Psychiatric: He has a normal mood and affect. His behavior is normal.     Results for orders placed in visit on 09/30/13  POCT CBC      Result Value Range   WBC 11.9 (*) 4.6 - 10.2 K/uL   Lymph, poc 3.8 (*) 0.6 - 3.4   POC LYMPH PERCENT 31.8  10 - 50 %L   MID (cbc) 0.7  0 - 0.9   POC MID % 5.9  0 - 12 %M   POC Granulocyte 7.4 (*) 2 - 6.9   Granulocyte percent 62.3  37 - 80 %G   RBC 5.03  4.69 - 6.13 M/uL   Hemoglobin 15.5  14.1 - 18.1 g/dL   HCT, POC 40.9  81.1 - 53.7 %   MCV 95.0  80 - 97 fL   MCH, POC 30.8  27 - 31.2 pg   MCHC 32.4  31.8 - 35.4 g/dL   RDW, POC 91.4     Platelet Count, POC 201  142 - 424 K/uL   MPV 7.9  0 - 99.8 fL    WBC near normal    Assessment & Plan:  Furuncle buttock Doxycycline/mupirocin

## 2013-09-30 NOTE — Progress Notes (Signed)
  Subjective:    Patient ID: [name], male    DOB: 1956-04-07, 57 y.o.   MRN: [id number]  HPI  Patient presents to clinic today for his elevated white count. He was seen preoperatively yesterday and his white count was elevated. He states he feels fine and has not had any recent illness. CBC repeated today.   Review of Systems     Objective:   Physical Exam        Assessment & Plan:

## 2013-10-04 ENCOUNTER — Other Ambulatory Visit: Payer: Self-pay | Admitting: Internal Medicine

## 2013-10-07 MED ORDER — CEFAZOLIN SODIUM-DEXTROSE 2-3 GM-% IV SOLR
2.0000 g | INTRAVENOUS | Status: AC
  Administered 2013-10-08: 2 g via INTRAVENOUS
  Filled 2013-10-07: qty 50

## 2013-10-08 ENCOUNTER — Encounter (HOSPITAL_COMMUNITY): Payer: Self-pay | Admitting: *Deleted

## 2013-10-08 ENCOUNTER — Encounter (HOSPITAL_COMMUNITY)
Admission: RE | Disposition: A | Discharge status: Home / Self Care | Payer: Self-pay | Source: Ambulatory Visit | Attending: Orthopedic Surgery

## 2013-10-08 ENCOUNTER — Inpatient Hospital Stay (HOSPITAL_COMMUNITY): Payer: BC Managed Care – PPO

## 2013-10-08 ENCOUNTER — Inpatient Hospital Stay (HOSPITAL_COMMUNITY): Payer: BC Managed Care – PPO | Admitting: Anesthesiology

## 2013-10-08 ENCOUNTER — Inpatient Hospital Stay (HOSPITAL_COMMUNITY)
Admission: RE | Admit: 2013-10-08 | Discharge: 2013-10-09 | DRG: 483 | Disposition: A | Discharge status: Home / Self Care | Payer: BC Managed Care – PPO | Source: Ambulatory Visit | Attending: Orthopedic Surgery | Admitting: Orthopedic Surgery

## 2013-10-08 ENCOUNTER — Encounter (HOSPITAL_COMMUNITY): Payer: BC Managed Care – PPO | Admitting: Vascular Surgery

## 2013-10-08 DIAGNOSIS — F909 Attention-deficit hyperactivity disorder, unspecified type: Secondary | ICD-10-CM | POA: Diagnosis present

## 2013-10-08 DIAGNOSIS — G4700 Insomnia, unspecified: Secondary | ICD-10-CM | POA: Diagnosis present

## 2013-10-08 DIAGNOSIS — M19019 Primary osteoarthritis, unspecified shoulder: Principal | ICD-10-CM | POA: Diagnosis present

## 2013-10-08 DIAGNOSIS — K219 Gastro-esophageal reflux disease without esophagitis: Secondary | ICD-10-CM | POA: Diagnosis present

## 2013-10-08 DIAGNOSIS — Z79899 Other long term (current) drug therapy: Secondary | ICD-10-CM

## 2013-10-08 DIAGNOSIS — Z7982 Long term (current) use of aspirin: Secondary | ICD-10-CM

## 2013-10-08 DIAGNOSIS — I10 Essential (primary) hypertension: Secondary | ICD-10-CM | POA: Diagnosis present

## 2013-10-08 DIAGNOSIS — Z87891 Personal history of nicotine dependence: Secondary | ICD-10-CM

## 2013-10-08 DIAGNOSIS — Z1211 Encounter for screening for malignant neoplasm of colon: Secondary | ICD-10-CM

## 2013-10-08 HISTORY — PX: TOTAL SHOULDER ARTHROPLASTY: SHX126

## 2013-10-08 LAB — TYPE AND SCREEN: ABO/RH(D): A NEG

## 2013-10-08 SURGERY — ARTHROPLASTY, SHOULDER, TOTAL
Anesthesia: Regional | Site: Shoulder | Laterality: Right | Wound class: Clean

## 2013-10-08 MED ORDER — TADALAFIL 20 MG PO TABS: 20.0000 mg | ORAL_TABLET | Freq: Every day | ORAL | Status: DC | PRN

## 2013-10-08 MED ORDER — METOCLOPRAMIDE HCL 5 MG/ML IJ SOLN: 5.0000 mg | Freq: Three times a day (TID) | INTRAMUSCULAR | Status: DC | PRN

## 2013-10-08 MED ORDER — METHOCARBAMOL 100 MG/ML IJ SOLN
500.0000 mg | Freq: Four times a day (QID) | INTRAVENOUS | Status: DC | PRN
  Filled 2013-10-08: qty 5

## 2013-10-08 MED ORDER — THROMBIN 5000 UNITS EX SOLR
CUTANEOUS | Status: DC | PRN
  Administered 2013-10-08: 5000 [IU] via TOPICAL

## 2013-10-08 MED ORDER — EPHEDRINE SULFATE 50 MG/ML IJ SOLN
INTRAMUSCULAR | Status: DC | PRN
  Administered 2013-10-08: 10 mg via INTRAVENOUS
  Administered 2013-10-08: 20 mg via INTRAVENOUS

## 2013-10-08 MED ORDER — FENTANYL CITRATE 0.05 MG/ML IJ SOLN
INTRAMUSCULAR | Status: DC | PRN
  Administered 2013-10-08: 100 ug via INTRAVENOUS

## 2013-10-08 MED ORDER — DOXYCYCLINE HYCLATE 100 MG PO TABS
100.0000 mg | ORAL_TABLET | Freq: Two times a day (BID) | ORAL | Status: DC
  Administered 2013-10-08 – 2013-10-09 (×2): 100 mg via ORAL
  Filled 2013-10-08 (×3): qty 1

## 2013-10-08 MED ORDER — ASPIRIN EC 81 MG PO TBEC
81.0000 mg | DELAYED_RELEASE_TABLET | ORAL | Status: DC
  Filled 2013-10-08: qty 1

## 2013-10-08 MED ORDER — ZOLPIDEM TARTRATE 5 MG PO TABS
10.0000 mg | ORAL_TABLET | Freq: Every evening | ORAL | Status: DC | PRN
  Administered 2013-10-08: 10 mg via ORAL
  Filled 2013-10-08: qty 2

## 2013-10-08 MED ORDER — OXYCODONE HCL 5 MG PO TABS: 5.0000 mg | ORAL_TABLET | Freq: Once | ORAL | Status: DC | PRN

## 2013-10-08 MED ORDER — BUPIVACAINE-EPINEPHRINE 0.25% -1:200000 IJ SOLN
INTRAMUSCULAR | Status: DC | PRN
  Administered 2013-10-08: 6 mL

## 2013-10-08 MED ORDER — MIDAZOLAM HCL 2 MG/2ML IJ SOLN
INTRAMUSCULAR | Status: AC
  Administered 2013-10-08: 2 mg
  Filled 2013-10-08: qty 2

## 2013-10-08 MED ORDER — LIDOCAINE HCL (CARDIAC) 20 MG/ML IV SOLN
INTRAVENOUS | Status: DC | PRN
  Administered 2013-10-08: 60 mg via INTRAVENOUS

## 2013-10-08 MED ORDER — METOCLOPRAMIDE HCL 10 MG PO TABS: 5.0000 mg | ORAL_TABLET | Freq: Three times a day (TID) | ORAL | Status: DC | PRN

## 2013-10-08 MED ORDER — OXYCODONE-ACETAMINOPHEN 5-325 MG PO TABS
1.0000 | ORAL_TABLET | ORAL | Status: DC | PRN
  Administered 2013-10-08 – 2013-10-09 (×4): 1 via ORAL
  Filled 2013-10-08 (×4): qty 1

## 2013-10-08 MED ORDER — SODIUM CHLORIDE 0.9 % IV SOLN
INTRAVENOUS | Status: DC
  Administered 2013-10-08: 21:00:00 via INTRAVENOUS

## 2013-10-08 MED ORDER — FAMOTIDINE 20 MG PO TABS
20.0000 mg | ORAL_TABLET | Freq: Every day | ORAL | Status: DC
  Administered 2013-10-08: 20 mg via ORAL
  Filled 2013-10-08 (×2): qty 1

## 2013-10-08 MED ORDER — NEOSTIGMINE METHYLSULFATE 1 MG/ML IJ SOLN
INTRAMUSCULAR | Status: DC | PRN
  Administered 2013-10-08: 4 mg via INTRAVENOUS

## 2013-10-08 MED ORDER — HYDROMORPHONE HCL PF 1 MG/ML IJ SOLN: 0.5000 mg | INTRAMUSCULAR | Status: DC | PRN

## 2013-10-08 MED ORDER — ACETAMINOPHEN 325 MG PO TABS: 650.0000 mg | ORAL_TABLET | Freq: Four times a day (QID) | ORAL | Status: DC | PRN

## 2013-10-08 MED ORDER — BUPIVACAINE-EPINEPHRINE PF 0.5-1:200000 % IJ SOLN
INTRAMUSCULAR | Status: DC | PRN
  Administered 2013-10-08: 30 mL

## 2013-10-08 MED ORDER — SODIUM CHLORIDE 0.9 % IR SOLN
Status: DC | PRN
  Administered 2013-10-08: 1000 mL

## 2013-10-08 MED ORDER — PROPOFOL 10 MG/ML IV BOLUS
INTRAVENOUS | Status: DC | PRN
  Administered 2013-10-08: 140 mg via INTRAVENOUS

## 2013-10-08 MED ORDER — ROCURONIUM BROMIDE 100 MG/10ML IV SOLN
INTRAVENOUS | Status: DC | PRN
  Administered 2013-10-08: 10 mg via INTRAVENOUS
  Administered 2013-10-08: 30 mg via INTRAVENOUS
  Administered 2013-10-08: 10 mg via INTRAVENOUS

## 2013-10-08 MED ORDER — OXYCODONE HCL 5 MG/5ML PO SOLN
5.0000 mg | Freq: Once | ORAL | Status: DC | PRN
Start: 2013-10-08 — End: 2013-10-08

## 2013-10-08 MED ORDER — LACTATED RINGERS IV SOLN
INTRAVENOUS | Status: DC
  Administered 2013-10-08: 50 mL/h via INTRAVENOUS

## 2013-10-08 MED ORDER — MENTHOL 3 MG MT LOZG: 1.0000 | LOZENGE | OROMUCOSAL | Status: DC | PRN

## 2013-10-08 MED ORDER — ONDANSETRON HCL 4 MG PO TABS: 4.0000 mg | ORAL_TABLET | Freq: Four times a day (QID) | ORAL | Status: DC | PRN

## 2013-10-08 MED ORDER — SODIUM CHLORIDE 0.9 % IV SOLN
10.0000 mg | INTRAVENOUS | Status: DC | PRN
  Administered 2013-10-08: 40 ug/min via INTRAVENOUS

## 2013-10-08 MED ORDER — MUPIROCIN 2 % EX OINT
TOPICAL_OINTMENT | Freq: Three times a day (TID) | CUTANEOUS | Status: DC
  Administered 2013-10-08 – 2013-10-09 (×2): via TOPICAL
  Filled 2013-10-08 (×2): qty 22

## 2013-10-08 MED ORDER — ACETAMINOPHEN 650 MG RE SUPP: 650.0000 mg | Freq: Four times a day (QID) | RECTAL | Status: DC | PRN

## 2013-10-08 MED ORDER — INFLUENZA VAC SPLIT QUAD 0.5 ML IM SUSP
0.5000 mL | INTRAMUSCULAR | Status: DC
  Filled 2013-10-08: qty 0.5

## 2013-10-08 MED ORDER — BUPIVACAINE-EPINEPHRINE PF 0.25-1:200000 % IJ SOLN
INTRAMUSCULAR | Status: AC
  Filled 2013-10-08: qty 30

## 2013-10-08 MED ORDER — AMPHETAMINE-DEXTROAMPHET ER 10 MG PO CP24
20.0000 mg | ORAL_CAPSULE | ORAL | Status: DC
  Administered 2013-10-09: 20 mg via ORAL
  Filled 2013-10-08: qty 2

## 2013-10-08 MED ORDER — METHOCARBAMOL 500 MG PO TABS
500.0000 mg | ORAL_TABLET | Freq: Four times a day (QID) | ORAL | Status: DC | PRN
  Administered 2013-10-09: 500 mg via ORAL
  Filled 2013-10-08: qty 1

## 2013-10-08 MED ORDER — HYDROMORPHONE HCL PF 1 MG/ML IJ SOLN: 0.2500 mg | INTRAMUSCULAR | Status: DC | PRN

## 2013-10-08 MED ORDER — FENTANYL CITRATE 0.05 MG/ML IJ SOLN
INTRAMUSCULAR | Status: AC
  Administered 2013-10-08: 50 ug
  Filled 2013-10-08: qty 2

## 2013-10-08 MED ORDER — SODIUM CHLORIDE 0.9 % IV SOLN: 500.0000 mL | INTRAVENOUS | Status: DC

## 2013-10-08 MED ORDER — HEMOSTATIC AGENTS (NO CHARGE) OPTIME
TOPICAL | Status: DC | PRN
  Administered 2013-10-08: 1 via TOPICAL

## 2013-10-08 MED ORDER — GLYCOPYRROLATE 0.2 MG/ML IJ SOLN
INTRAMUSCULAR | Status: DC | PRN
  Administered 2013-10-08: 0.6 mg via INTRAVENOUS

## 2013-10-08 MED ORDER — PHENOL 1.4 % MT LIQD: 1.0000 | OROMUCOSAL | Status: DC | PRN

## 2013-10-08 MED ORDER — LACTATED RINGERS IV SOLN
INTRAVENOUS | Status: DC | PRN
  Administered 2013-10-08 (×2): via INTRAVENOUS

## 2013-10-08 MED ORDER — CHLORHEXIDINE GLUCONATE 4 % EX LIQD: 60.0000 mL | Freq: Once | CUTANEOUS | Status: DC

## 2013-10-08 MED ORDER — ONDANSETRON HCL 4 MG/2ML IJ SOLN: 4.0000 mg | Freq: Four times a day (QID) | INTRAMUSCULAR | Status: DC | PRN

## 2013-10-08 MED ORDER — THROMBIN 5000 UNITS EX SOLR
CUTANEOUS | Status: AC
  Filled 2013-10-08: qty 5000

## 2013-10-08 MED ORDER — CEFAZOLIN SODIUM-DEXTROSE 2-3 GM-% IV SOLR
2.0000 g | Freq: Four times a day (QID) | INTRAVENOUS | Status: DC
  Administered 2013-10-08 – 2013-10-09 (×2): 2 g via INTRAVENOUS
  Filled 2013-10-08 (×3): qty 50

## 2013-10-08 SURGICAL SUPPLY — 69 items
BLADE SAW SAG 73X25 THK (BLADE) ×1
BLADE SAW SGTL 73X25 THK (BLADE) ×1 IMPLANT
BUR SURG 4X8 MED (BURR) IMPLANT
BURR SURG 4X8 MED (BURR)
CEMENT BONE DEPUY (Cement) ×2 IMPLANT
CLOTH BEACON ORANGE TIMEOUT ST (SAFETY) ×2 IMPLANT
CLSR STERI-STRIP ANTIMIC 1/2X4 (GAUZE/BANDAGES/DRESSINGS) ×2 IMPLANT
COVER SURGICAL LIGHT HANDLE (MISCELLANEOUS) ×2 IMPLANT
DRAPE INCISE IOBAN 66X45 STRL (DRAPES) ×6 IMPLANT
DRAPE U-SHAPE 47X51 STRL (DRAPES) ×2 IMPLANT
DRAPE X-RAY CASS 24X20 (DRAPES) IMPLANT
DRILL BIT 5/64 (BIT) ×2 IMPLANT
DRSG ADAPTIC 3X8 NADH LF (GAUZE/BANDAGES/DRESSINGS) ×2 IMPLANT
DRSG PAD ABDOMINAL 8X10 ST (GAUZE/BANDAGES/DRESSINGS) ×2 IMPLANT
DURAPREP 26ML APPLICATOR (WOUND CARE) ×2 IMPLANT
ELECT BLADE 4.0 EZ CLEAN MEGAD (MISCELLANEOUS) ×2
ELECT NEEDLE TIP 2.8 STRL (NEEDLE) ×2 IMPLANT
ELECT REM PT RETURN 9FT ADLT (ELECTROSURGICAL) ×2
ELECTRODE BLDE 4.0 EZ CLN MEGD (MISCELLANEOUS) ×1 IMPLANT
ELECTRODE REM PT RTRN 9FT ADLT (ELECTROSURGICAL) ×1 IMPLANT
GLENOID ANCHOR PEG CROSSLK 44 (Orthopedic Implant) ×2 IMPLANT
GLOVE BIOGEL PI ORTHO PRO 7.5 (GLOVE) ×1
GLOVE BIOGEL PI ORTHO PRO SZ8 (GLOVE) ×1
GLOVE ORTHO TXT STRL SZ7.5 (GLOVE) ×2 IMPLANT
GLOVE PI ORTHO PRO STRL 7.5 (GLOVE) ×1 IMPLANT
GLOVE PI ORTHO PRO STRL SZ8 (GLOVE) ×1 IMPLANT
GLOVE SURG ORTHO 8.5 STRL (GLOVE) ×4 IMPLANT
GOWN STRL REIN XL XLG (GOWN DISPOSABLE) ×6 IMPLANT
HANDPIECE INTERPULSE COAX TIP (DISPOSABLE)
HEAD HUM ECCENTRIC 48X21 STRL (Trauma) ×2 IMPLANT
KIT BASIN OR (CUSTOM PROCEDURE TRAY) ×2 IMPLANT
KIT ROOM TURNOVER OR (KITS) ×2 IMPLANT
MANIFOLD NEPTUNE II (INSTRUMENTS) ×2 IMPLANT
NDL SUT 6 .5 CRC .975X.05 MAYO (NEEDLE) ×1 IMPLANT
NEEDLE 1/2 CIR MAYO (NEEDLE) ×2 IMPLANT
NEEDLE HYPO 25GX1X1/2 BEV (NEEDLE) ×2 IMPLANT
NEEDLE MAYO TAPER (NEEDLE) ×1
NS IRRIG 1000ML POUR BTL (IV SOLUTION) ×2 IMPLANT
PACK SHOULDER (CUSTOM PROCEDURE TRAY) ×2 IMPLANT
PAD ARMBOARD 7.5X6 YLW CONV (MISCELLANEOUS) ×4 IMPLANT
PIN METAGLENE 2.5 (PIN) ×2 IMPLANT
SET HNDPC FAN SPRY TIP SCT (DISPOSABLE) IMPLANT
SLING ARM FOAM STRAP LRG (SOFTGOODS) ×2 IMPLANT
SLING ARM IMMOBILIZER LRG (SOFTGOODS) ×2 IMPLANT
SLING ARM IMMOBILIZER MED (SOFTGOODS) IMPLANT
SMARTMIX MINI TOWER (MISCELLANEOUS) ×2
SPONGE GAUZE 4X4 12PLY (GAUZE/BANDAGES/DRESSINGS) ×2 IMPLANT
SPONGE LAP 18X18 X RAY DECT (DISPOSABLE) ×2 IMPLANT
SPONGE LAP 4X18 X RAY DECT (DISPOSABLE) ×2 IMPLANT
SPONGE SURGIFOAM ABS GEL SZ50 (HEMOSTASIS) IMPLANT
STEM HUMERAL 12MM (Trauma) ×2 IMPLANT
STRIP CLOSURE SKIN 1/2X4 (GAUZE/BANDAGES/DRESSINGS) ×2 IMPLANT
SUCTION FRAZIER TIP 10 FR DISP (SUCTIONS) ×2 IMPLANT
SUT FIBERWIRE #2 38 T-5 BLUE (SUTURE) ×4
SUT MNCRL AB 4-0 PS2 18 (SUTURE) ×2 IMPLANT
SUT VIC AB 0 CT1 27 (SUTURE) ×1
SUT VIC AB 0 CT1 27XBRD ANBCTR (SUTURE) ×1 IMPLANT
SUT VIC AB 2-0 CT1 27 (SUTURE) ×1
SUT VIC AB 2-0 CT1 TAPERPNT 27 (SUTURE) ×1 IMPLANT
SUT VICRYL AB 2 0 TIES (SUTURE) ×2 IMPLANT
SUTURE FIBERWR #2 38 T-5 BLUE (SUTURE) ×2 IMPLANT
SYR CONTROL 10ML LL (SYRINGE) ×2 IMPLANT
TAPE CLOTH SURG 6X10 WHT LF (GAUZE/BANDAGES/DRESSINGS) ×2 IMPLANT
TOWEL OR 17X24 6PK STRL BLUE (TOWEL DISPOSABLE) ×2 IMPLANT
TOWEL OR 17X26 10 PK STRL BLUE (TOWEL DISPOSABLE) ×2 IMPLANT
TOWER SMARTMIX MINI (MISCELLANEOUS) ×1 IMPLANT
TRAY FOLEY CATH 16FRSI W/METER (SET/KITS/TRAYS/PACK) ×2 IMPLANT
WATER STERILE IRR 1000ML POUR (IV SOLUTION) ×2 IMPLANT
YANKAUER SUCT BULB TIP NO VENT (SUCTIONS) IMPLANT

## 2013-10-08 NOTE — Care Management (Signed)
[facility] has been set up with this patient properatively from the MD office for any [facility] needs.  Thank you. Ayesha Rumpf RN, BSN [facility]

## 2013-10-08 NOTE — Interval H&P Note (Signed)
History and Physical Interval Note:  10/08/2013 10:52 AM  [name]  has presented today for surgery, with the diagnosis of RIGHT SHOULDER OA   The various methods of treatment have been discussed with the patient and family. After consideration of risks, benefits and other options for treatment, the patient has consented to  Procedure(s): RIGHT TOTAL SHOULDER ARTHROPLASTY (Right) as a surgical intervention .  The patient's history has been reviewed, patient examined, no change in status, stable for surgery.  I have reviewed the patient's chart and labs.  Questions were answered to the patient's satisfaction.     Melodee Lupe,STEVEN R

## 2013-10-08 NOTE — Anesthesia Procedure Notes (Signed)
Anesthesia Regional Block:  Interscalene brachial plexus block  Pre-Anesthetic Checklist: ,, timeout performed, Correct Patient, Correct Site, Correct Laterality, Correct Procedure, Correct Position, site marked, Risks and benefits discussed, pre-op evaluation,  At surgeon's request and post-op pain management  Laterality: Right  Prep: Maximum Sterile Barrier Precautions used and chloraprep       Needles:  Injection technique: Single-shot  Needle Type: Echogenic Stimulator Needle     Needle Length: 5cm 5 cm Needle Gauge: 22 and 22 G    Additional Needles:  Procedures: ultrasound guided (picture in chart) and nerve stimulator Interscalene brachial plexus block  Nerve Stimulator or Paresthesia:  Response: Biceps response,   Additional Responses:   Narrative:  Start time: 10/08/2013 10:22 AM End time: 10/08/2013 10:34 AM Injection made incrementally with aspirations every 5 mL. Anesthesiologist: Sampson Goon, MD  Additional Notes: 2% Lidocaine skin wheel.   Interscalene brachial plexus block

## 2013-10-08 NOTE — Brief Op Note (Signed)
10/08/2013  2:39 PM  PATIENT:  [name]  57 y.o. male  PRE-OPERATIVE DIAGNOSIS:  RIGHT SHOULDER OA, END STAGED  POST-OPERATIVE DIAGNOSIS:  RIGHT SHOULDER OA, END STAGE  PROCEDURE:  Procedure(s): RIGHT TOTAL SHOULDER ARTHROPLASTY (Right), [employer]  SURGEON:  Surgeon(s) and Role:    * [name], MD - Primary  PHYSICIAN ASSISTANT:   ASSISTANTS: Thea Gist, PA-C   ANESTHESIA:   regional and general  EBL:  Total I/O In: 900 [I.V.:900] Out: 150 [Blood:150]  BLOOD ADMINISTERED:none  DRAINS: none   LOCAL MEDICATIONS USED:  MARCAINE     SPECIMEN:  No Specimen  DISPOSITION OF SPECIMEN:  N/A  COUNTS:  YES  TOURNIQUET:  * No tourniquets in log *  DICTATION: .Other Dictation: Dictation Number [id number]  PLAN OF CARE: Admit to inpatient   PATIENT DISPOSITION:  PACU - hemodynamically stable.   Delay start of Pharmacological VTE agent (>24hrs) due to surgical blood loss or risk of bleeding: not applicable

## 2013-10-08 NOTE — Preoperative (Signed)
Beta Blockers   Reason not to administer Beta Blockers:Not Applicable 

## 2013-10-08 NOTE — Anesthesia Preprocedure Evaluation (Addendum)
Anesthesia Evaluation  Patient identified by MRN, date of birth, ID band Patient awake    Reviewed: Allergy & Precautions, H&P , NPO status , Patient's Chart, lab work & pertinent test results  Airway Mallampati: II TM Distance: >3 FB Neck ROM: Full    Dental no notable dental hx. (+) Teeth Intact and Dental Advisory Given   Pulmonary neg pulmonary ROS,  breath sounds clear to auscultation  Pulmonary exam normal       Cardiovascular hypertension, Rhythm:Regular Rate:Normal     Neuro/Psych ADHDnegative neurological ROS  negative psych ROS   GI/Hepatic Neg liver ROS, GERD-  Medicated and Controlled,  Endo/Other  negative endocrine ROS  Renal/GU negative Renal ROS  negative genitourinary   Musculoskeletal   Abdominal   Peds  Hematology negative hematology ROS (+)   Anesthesia Other Findings   Reproductive/Obstetrics negative OB ROS                          Anesthesia Physical Anesthesia Plan  ASA: II  Anesthesia Plan: General and Regional   Post-op Pain Management:    Induction: Intravenous  Airway Management Planned: Oral ETT  Additional Equipment:   Intra-op Plan:   Post-operative Plan: Extubation in OR  Informed Consent: I have reviewed the patients History and Physical, chart, labs and discussed the procedure including the risks, benefits and alternatives for the proposed anesthesia with the patient or authorized representative who has indicated his/her understanding and acceptance.   Dental advisory given  Plan Discussed with: CRNA  Anesthesia Plan Comments:         Anesthesia Quick Evaluation

## 2013-10-08 NOTE — Transfer of Care (Signed)
Immediate Anesthesia Transfer of Care Note  Patient: [name]  Procedure(s) Performed: Procedure(s): RIGHT TOTAL SHOULDER ARTHROPLASTY (Right)  Patient Location: PACU  Anesthesia Type:General  Level of Consciousness: awake, alert  and oriented  Airway & Oxygen Therapy: Patient Spontanous Breathing and Patient connected to nasal cannula oxygen  Post-op Assessment: Report given to PACU RN, Post -op Vital signs reviewed and stable and Patient moving all extremities  Post vital signs: Reviewed and stable  Complications: No apparent anesthesia complications

## 2013-10-09 LAB — BASIC METABOLIC PANEL
BUN: 12 mg/dL (ref 6–23)
Calcium: 8 mg/dL — ABNORMAL LOW (ref 8.4–10.5)
GFR calc non Af Amer: >90 mL/min (ref >90)
Potassium: 3.7 mEq/L (ref 3.5–5.1)
Sodium: 136 mEq/L (ref 135–145)

## 2013-10-09 LAB — HEMOGLOBIN AND HEMATOCRIT, BLOOD: HCT: 38.8 % — ABNORMAL LOW (ref 39.0–52.0)

## 2013-10-09 MED ORDER — OXYCODONE-ACETAMINOPHEN 5-325 MG PO TABS: 1.0000 | ORAL_TABLET | ORAL | Status: DC | PRN

## 2013-10-09 MED ORDER — METHOCARBAMOL 500 MG PO TABS: 500.0000 mg | ORAL_TABLET | Freq: Four times a day (QID) | ORAL | Status: DC | PRN

## 2013-10-09 NOTE — Op Note (Signed)
[name], [name] NO.:  [id number]  MEDICAL RECORD NO.:  [id number]  LOCATION:  [id number]                        FACILITY:  [facility]  PHYSICIAN:  [name], M.D. DATE OF BIRTH:  [birth year]  DATE OF PROCEDURE:  10/08/2013 DATE OF DISCHARGE:  10/09/2013                              OPERATIVE REPORT   PREOPERATIVE DIAGNOSIS:  Right shoulder end-stage osteoarthritis.  POSTOPERATIVE DIAGNOSIS:  Right shoulder end-stage osteoarthritis.  PROCEDURE PERFORMED:  Right total shoulder arthroplasty using DePuy Global Advantage system.  ATTENDING SURGEON:  [name], M.D.  ASSISTANT:  [name]. Dixon, PA-C, who was scrubbed the entire procedure and necessary for satisfactory completion of surgery.  ANESTHESIA:  General anesthesia was used plus interscalene block.  ESTIMATED BLOOD LOSS:  100 mL.  FLUID REPLACED:  1300 mL of crystalloid.  INSTRUMENT COUNTS:  Correct.  COMPLICATIONS:  There were no complications.  ANTIBIOTICS:  Perioperative antibiotics given.  INDICATIONS:  The patient is a 57 year old male with worsening right shoulder pain secondary to end-stage arthritis.  The patient has complete loss of articular cartilage in the shoulder joint with subchondral cyst formation, large marginal osteophytes.  The patient has had progressive pain despite conservative management including injections, modification of activity, anti-inflammatories, and pain medication.  The patient presents for operative treatment to restore function, eliminate pain.  Informed consent obtained.  DESCRIPTION OF PROCEDURE:  After an adequate level of anesthesia was achieved, the patient was placed in a modified beach-chair position. Right shoulder correctly identified, sterilely prepped and draped in usual manner.  Time-out was called.  We entered the shoulder using standard deltopectoral incision, started at the coracoid process extended down to the anterior humerus.   Dissection down through subcutaneous tissues using Bovie electrocautery.  Cephalic vein identified and taken laterally with the deltoid.  The pectoralis taken medially.  The conjoined tendon was identified and retracted medially as well.  Deep retractors were placed.  The subscapularis was taken down subperiosteally off the lesser tuberosity at the biceps groove. Dissection was carried out down to the capsule, which was released off the inferior humeral neck as well.  #2 FiberWire sutures were placed in a modified Mason-Allen suture technique in the free end of the tendon for repair later.  We next went ahead and resected our humeral head with a T-handled Geographical information systems officer.  We then went ahead and marked our head resection at 20 degrees of retroversion and resected that with the oscillating saw based on her head resection guide.  Once we had done that, we removed all osteophytes off the inferior portion of the humeral neck.  We then went ahead and prepared the humeral shaft with sequential reaming up to size 12 reamer and then broached for a size 12 Global Advantage stem.  We then retracted the humerus posteriorly.  We then performed a 360-degree capsular removal, careful to protect the axillary nerve.  We had excellent exposure.  We resected the remaining biceps tendon.  We went ahead and found our center point with a drill guide and then once we had our center guide pin drilled, we went ahead and reamed, first with a low profile reamer and then with a  hand reamer over the top of that for 44 anchor pegged glenoid by DePuy.  We next drilled our central lug hole and central peg hole for the anchor peg and then 3 peripheral holes with the guide referencing off the 6 o'clock position on the inferior scapula.  We were careful again to protect the axillary nerve.  We dried the peripheral holes with Gelfoam soaked with thrombin. We next went ahead and mixed vacuum, mixed our DePuy 1 cement  and cemented the real anchor pegged glenoid into position disseminating the 3 peripheral holes and held pressure on the glenoid implant until that was hardened.  We next went ahead and thoroughly irrigated the socket and the wound and then went ahead and trialed with a size 48 x 21 eccentric prosthesis, which gave [place] good bony coverage and then ranged the shoulder and had nice soft tissue balance and coverage.  We removed the trial components, we thoroughly irrigated, we drilled holes through the proximal humerus and the lesser tuberosity, placed #2 FiberWire suture for repair of the subscap.  We then went ahead and impacted our real implants into place with impaction grafting technique.  A size 12 Global Advantage stem followed by a 48 x 21 eccentric head with that eccentricity dialed superiorly for the best bony coverage.  We then reduced the shoulder, again we were happy with soft tissue balancing and then went ahead and did our anatomic repair of the subscapularis to bone with sutures through bone and also repairing the rotator interval and then over-sewing with additional 2 FiberWire.  We had nice repair of the subscap, we then ranged the shoulder, we thoroughly irrigated the shoulder and then repaired deltopectoral interval with 0-Vicryl suture followed by 2-0 Vicryl subcutaneous closure and 4-0 Monocryl for skin and portals.  Steri-Strips applied followed by sterile dressing.  The patient tolerated surgery well.     [name], M.D.     SRN/MEDQ  D:  10/08/2013  T:  10/09/2013  Job:  [id number]

## 2013-10-09 NOTE — Evaluation (Signed)
Occupational Therapy Evaluation and Discharge Patient Details Name: [name] MRN: [id number] DOB: 1956/02/20 Today's Date: 10/09/2013 Time: 4098-1191 OT Time Calculation (min): 47 min  OT Assessment / Plan / Recommendation History of present illness RTSA   Clinical Impression   This 57 yo male admitted and underwent above presents to acute OT with all education completed. Follow up per MD. Acute OT will sign off.    OT Assessment  Progress rehab of shoulder as ordered by MD at follow-up appointment    Follow Up Recommendations  No OT follow up       Equipment Recommendations  None recommended by OT          Precautions / Restrictions Precautions Precautions: Shoulder Shoulder Interventions: Shoulder sling/immobilizer;At all times;Off for dressing/bathing/exercises Precaution Booklet Issued: Yes (comment) Required Braces or Orthoses: Sling Restrictions Weight Bearing Restrictions: Yes RUE Weight Bearing: Non weight bearing   Pertinent Vitals/Pain 3/10 Right shoulder; repositioned and pre-medicated per pt    ADL  Transfers/Ambulation Related to ADLs: Independent with all     Acute Rehab OT Goals Patient Stated Goal: home today  Visit Information  Last OT Received On: 10/09/13 Assistance Needed: +1 History of Present Illness: RTSA       Prior Functioning     Home Living Family/patient expects to be discharged to:: Private residence Living Arrangements: Spouse/significant other Available Help at Discharge: Family Prior Function Level of Independence: Independent Communication Communication: No difficulties Dominant Hand: Right         Vision/Perception Vision - History Patient Visual Report: No change from baseline   Cognition  Cognition Arousal/Alertness: Awake/alert Behavior During Therapy: WFL for tasks assessed/performed Overall Cognitive Status: Within Functional Limits for tasks assessed    Extremity/Trunk Assessment Upper Extremity  Assessment Upper Extremity Assessment: RUE deficits/detail RUE Deficits / Details: TSA; elbow to hand WNL for AROM RUE Coordination: decreased gross motor     Mobility Bed Mobility Bed Mobility: Supine to Sit;Sitting - Scoot to Edge of Bed Supine to Sit: 6: Modified independent (Device/Increase time);With rails Sitting - Scoot to Edge of Bed: 6: Modified independent (Device/Increase time);With rail Transfers Transfers: Sit to Stand;Stand to Sit Sit to Stand: 7: Independent;With upper extremity assist;From bed (LUE) Stand to Sit: 7: Independent;With upper extremity assist;To bed (LUE)     Exercise Other Exercises Other Exercises: Dr. Ranell Patrick TSA protocol of supine no more than 30 degrees of external rotation and no more than 90 degrees of shoulder flexion; Pendulums, elbow flex/extension, NWB Donning/doffing shirt without moving shoulder: Minimal assistance Method for sponge bathing under operated UE: Supervision/safety Donning/doffing sling/immobilizer: Modified independent Correct positioning of sling/immobilizer: Independent Pendulum exercises (written home exercise program): Independent ROM for elbow, wrist and digits of operated UE: Independent Sling wearing schedule (on at all times/off for ADL's): Independent Proper positioning of operated UE when showering: Independent Dressing change:  (na) Positioning of UE while sleeping: Independent      End of Session OT - End of Session Activity Tolerance: Patient tolerated treatment well Patient left:  (up in his room) Nurse Communication:  (Pt ready to go)       Evette Georges [phone] 10/09/2013, 11:16 AM

## 2013-10-09 NOTE — Progress Notes (Signed)
Subjective: 1 Day Post-Op Procedure(s) (LRB): RIGHT TOTAL SHOULDER ARTHROPLASTY (Right) Patient reports pain as mild.  Seen in AM rounds with Dr. Shelle Iron. Pt's son in room with him this AM. He is seated on the side of his bed and eating breakfast. Denies numbness or tingling. Has not yet seen OT. He would like to go home today and states his initial plan with Dr. Ranell Patrick was an overnight stay. He feels his pain is well controlled. Voiding without difficulty. No flatus or BM yet.  Objective: Vital signs in last 24 hours: Temp:  [96.7 F (35.9 C)-98.6 F (37 C)] 98.2 F (36.8 C) (11/08 0512) Pulse Rate:  [63-84] 76 (11/08 0512) Resp:  [13-29] 16 (11/08 0512) BP: (113-151)/(68-105) 127/74 mmHg (11/08 0512) SpO2:  [96 %-99 %] 98 % (11/08 0512)  Intake/Output from previous day: 11/07 0701 - 11/08 0700 In: 1340 [P.O.:240; I.V.:1100] Out: 150 [Blood:150] Intake/Output this shift:     Recent Labs  10/09/13 0434  HGB 13.3    Recent Labs  10/09/13 0434  HCT 38.8*    Recent Labs  10/09/13 0434  NA 136  K 3.7  CL 103  CO2 24  BUN 12  CREATININE 0.93  GLUCOSE 107*  CALCIUM 8.0*   No results found for this basename: LABPT, INR,  in the last 72 hours  Neurologically intact ABD soft Neurovascular intact Sensation intact distally Intact pulses distally Dorsiflexion/Plantar flexion intact Incision: dressing C/D/I and no drainage No cellulitis present Compartment soft no calf pain or sign of DVT  Assessment/Plan: 1 Day Post-Op Procedure(s) (LRB): RIGHT TOTAL SHOULDER ARTHROPLASTY (Right) Advance diet Up with therapy D/C IV fluids OT today, total shoulder protocol Ok for D/C home later today as long as pain remains controlled with PO meds and does well with OT Discussed D/C instructions, dressing change tomorrow then daily Please send with dressing supplies upon D/C  BISSELL, JACLYN M. 10/09/2013, 9:06 AM

## 2013-10-09 NOTE — Discharge Summary (Signed)
Physician Discharge Summary   Patient ID: [name] MRN: [id number] DOB/AGE: 06-05-1956 57 y.o.  Admit date: 10/08/2013 Discharge date: 10/09/2013  Primary Diagnosis:   RIGHT SHOULDER OA   Admission Diagnoses:  Past Medical History  Diagnosis Date  . ADHD (attention deficit hyperactivity disorder)   . Smoker     quit about acouple yrs ago  . GERD (gastroesophageal reflux disease)     occasionally  . Insomnia    Discharge Diagnoses:   Active Problems:   * No active hospital problems. *  Procedure:  Procedure(s) (LRB): RIGHT TOTAL SHOULDER ARTHROPLASTY (Right)   Consults: None  HPI:  see H&P    Laboratory Data: Office Visit on 09/30/2013  Component Date Value Range Status  . WBC 09/30/2013 11.9* 4.6 - 10.2 K/uL Final  . Lymph, poc 09/30/2013 3.8* 0.6 - 3.4 Final  . POC LYMPH PERCENT 09/30/2013 31.8  10 - 50 %L Final  . MID (cbc) 09/30/2013 0.7  0 - 0.9 Final  . POC MID % 09/30/2013 5.9  0 - 12 %M Final  . POC Granulocyte 09/30/2013 7.4* 2 - 6.9 Final  . Granulocyte percent 09/30/2013 62.3  37 - 80 %G Final  . RBC 09/30/2013 5.03  4.69 - 6.13 M/uL Final  . Hemoglobin 09/30/2013 15.5  14.1 - 18.1 g/dL Final  . HCT, POC 60/63/0160 47.8  43.5 - 53.7 % Final  . MCV 09/30/2013 95.0  80 - 97 fL Final  . MCH, POC 09/30/2013 30.8  27 - 31.2 pg Final  . MCHC 09/30/2013 32.4  31.8 - 35.4 g/dL Final  . RDW, POC 10/93/2355 13.8   Final  . Platelet Count, POC 09/30/2013 201  142 - 424 K/uL Final  . MPV 09/30/2013 7.9  0 - 99.8 fL Final    Recent Labs  10/09/13 0434  HGB 13.3    Recent Labs  10/09/13 0434  HCT 38.8*    Recent Labs  10/09/13 0434  NA 136  K 3.7  CL 103  CO2 24  BUN 12  CREATININE 0.93  GLUCOSE 107*  CALCIUM 8.0*   No results found for this basename: LABPT, INR,  in the last 72 hours  X-Rays:Dg Shoulder Right Port  10/08/2013   CLINICAL DATA:  Postop  EXAM: PORTABLE RIGHT SHOULDER - 2+ VIEW  COMPARISON:  None.  FINDINGS: Components  of right arthroplasty project in expected location on this single radiograph. No fracture or dislocation evident. There is widening of the acromioclavicular space.  IMPRESSION: 1. Right shoulder arthroplasty without apparent complication on this single projection. 2. Acromioclavicular widening of uncertain chronicity.   Electronically Signed   By: Oley Balm M.D.   On: 10/08/2013 17:59    EKG: Orders placed in visit on 03/15/13  . EKG 12-LEAD     Hospital Course: Patient was admitted to [facility] and taken to the OR and underwent the above stated procedure without complications.  Patient tolerated the procedure well and was later transferred to the recovery room and then to the orthopaedic floor for postoperative care.  They were given PO and IV analgesics for pain control following their surgery.  They were given 24 hours of postoperative antibiotics.   OT/PT was consulted postop to assist with mobility and transfers, total shoulder protocol exercises. Discharge planning was consulted to help with postop disposition and equipment needs.  Patient had a good night on the evening of surgery and started to get up OOB with therapy on day one.  Patient was seen in rounds with Dr. Shelle Iron and was ready to go home on day one.  They were given discharge instructions and dressing directions- to change dressing tomorrow on POD #2.  They were instructed on when to follow up in the office with Dr. Ranell Patrick.  Discharge Medications: Prior to Admission medications   Medication Sig Start Date End Date Taking? Authorizing Provider  amphetamine-dextroamphetamine (ADDERALL XR) 20 MG 24 hr capsule Take 1 capsule (20 mg total) by mouth every morning. 09/09/13  Yes Jonita Albee, MD  aspirin 81 MG tablet Take 81 mg by mouth every other day.   Yes Historical Provider, MD  doxycycline (VIBRA-TABS) 100 MG tablet Take 1 tablet (100 mg total) by mouth 2 (two) times daily. 09/30/13  Yes Jonita Albee, MD  tadalafil  (CIALIS) 20 MG tablet Take 1 tablet (20 mg total) by mouth daily as needed for erectile dysfunction. 03/15/13  Yes Jonita Albee, MD  zolpidem (AMBIEN) 10 MG tablet Take 1 tablet (10 mg total) by mouth at bedtime as needed for sleep. 03/15/13  Yes Jonita Albee, MD  famotidine (PEPCID) 20 MG tablet Take 20 mg by mouth at bedtime.    Historical Provider, MD  methocarbamol (ROBAXIN) 500 MG tablet Take 1 tablet (500 mg total) by mouth every 6 (six) hours as needed for muscle spasms. 10/09/13   Dayna Barker. Bissell, PA-C  mupirocin ointment (BACTROBAN) 2 % Apply topically 3 (three) times daily. 09/30/13   Jonita Albee, MD  oxyCODONE-acetaminophen (PERCOCET/ROXICET) 5-325 MG per tablet Take 1-2 tablets by mouth every 4 (four) hours as needed for moderate pain. 10/09/13   Dayna Barker. Christene Lye, PA-C    Diet: Regular diet Activity:WBAT Follow-up:in 10-14 days Disposition - Home Discharged Condition: good   Discharge Orders   Future Appointments Provider Department Dept Phone   03/28/2014 9:30 AM Jonita Albee, MD [facility] [phone]   Future Orders Complete By Expires   Call MD / Call 911  As directed    Comments:     If you experience chest pain or shortness of breath, CALL 911 and be transported to the hospital emergency room.  If you develope a fever above 101 F, pus (white drainage) or increased drainage or redness at the wound, or calf pain, call your surgeon's office.   Constipation Prevention  As directed    Comments:     Drink plenty of fluids.  Prune juice may be helpful.  You may use a stool softener, such as Colace (over the counter) 100 mg twice a day.  Use MiraLax (over the counter) for constipation as needed.   Diet - low sodium heart healthy  As directed    Increase activity slowly as tolerated  As directed        Medication List         amphetamine-dextroamphetamine 20 MG 24 hr capsule  Commonly known as:  ADDERALL XR  Take 1 capsule (20 mg total) by mouth every  morning.     aspirin 81 MG tablet  Take 81 mg by mouth every other day.     doxycycline 100 MG tablet  Commonly known as:  VIBRA-TABS  Take 1 tablet (100 mg total) by mouth 2 (two) times daily.     famotidine 20 MG tablet  Commonly known as:  PEPCID  Take 20 mg by mouth at bedtime.     methocarbamol 500 MG tablet  Commonly known as:  ROBAXIN  Take 1  tablet (500 mg total) by mouth every 6 (six) hours as needed for muscle spasms.     mupirocin ointment 2 %  Commonly known as:  BACTROBAN  Apply topically 3 (three) times daily.     oxyCODONE-acetaminophen 5-325 MG per tablet  Commonly known as:  PERCOCET/ROXICET  Take 1-2 tablets by mouth every 4 (four) hours as needed for moderate pain.     tadalafil 20 MG tablet  Commonly known as:  CIALIS  Take 1 tablet (20 mg total) by mouth daily as needed for erectile dysfunction.     zolpidem 10 MG tablet  Commonly known as:  AMBIEN  Take 1 tablet (10 mg total) by mouth at bedtime as needed for sleep.         SignedDorothy Spark. 10/09/2013, 12:09 PM

## 2013-10-11 ENCOUNTER — Encounter (HOSPITAL_COMMUNITY): Payer: Self-pay | Admitting: Orthopedic Surgery

## 2013-10-13 NOTE — Anesthesia Postprocedure Evaluation (Signed)
Anesthesia Post Note  Patient: [name]  Procedure(s) Performed: Procedure(s) (LRB): RIGHT TOTAL SHOULDER ARTHROPLASTY (Right)  Anesthesia type: General  Patient location: PACU  Post pain: Pain level controlled  Post assessment: Patient's Cardiovascular Status Stable  Last Vitals:  Filed Vitals:   10/09/13 0512  BP: 127/74  Pulse: 76  Temp: 36.8 C  Resp: 16    Post vital signs: Reviewed and stable  Level of consciousness: alert  Complications: No apparent anesthesia complications

## 2013-11-03 ENCOUNTER — Telehealth: Payer: Self-pay

## 2013-11-03 DIAGNOSIS — G4700 Insomnia, unspecified: Secondary | ICD-10-CM

## 2013-11-03 DIAGNOSIS — F909 Attention-deficit hyperactivity disorder, unspecified type: Secondary | ICD-10-CM

## 2013-11-03 DIAGNOSIS — N529 Male erectile dysfunction, unspecified: Secondary | ICD-10-CM

## 2013-11-03 DIAGNOSIS — Z7189 Other specified counseling: Secondary | ICD-10-CM

## 2013-11-03 NOTE — Telephone Encounter (Signed)
Pended please advise.  

## 2013-11-03 NOTE — Telephone Encounter (Signed)
Guest - Pt's wife called requesting refill on cialis.  [phone]

## 2013-11-05 NOTE — Telephone Encounter (Signed)
Pt would like to know if another provider could fill his cialis for him, since Dr.Guest will not be back in the office until Sunday.  Best# [phone] [facility] [address]

## 2013-11-07 NOTE — Telephone Encounter (Signed)
Ok to refill cialis for one year

## 2013-11-08 MED ORDER — TADALAFIL 20 MG PO TABS: 20.0000 mg | ORAL_TABLET | Freq: Every day | ORAL | Status: DC | PRN

## 2013-11-08 NOTE — Telephone Encounter (Signed)
done

## 2013-12-09 ENCOUNTER — Telehealth: Payer: Self-pay

## 2013-12-09 DIAGNOSIS — R509 Fever, unspecified: Secondary | ICD-10-CM

## 2013-12-09 DIAGNOSIS — F909 Attention-deficit hyperactivity disorder, unspecified type: Secondary | ICD-10-CM

## 2013-12-09 DIAGNOSIS — Z7189 Other specified counseling: Secondary | ICD-10-CM

## 2013-12-09 NOTE — Telephone Encounter (Signed)
pts wife calling to request adderall refill on pts behalf Pt usually sees Dr Elder Cyphers.   Please call when ready.  bf

## 2013-12-15 NOTE — Telephone Encounter (Signed)
Patient calling to see when his Addrerall will be ready.  [phone]

## 2013-12-20 MED ORDER — AMPHETAMINE-DEXTROAMPHET ER 20 MG PO CP24: 20.0000 mg | ORAL_CAPSULE | ORAL | Status: DC

## 2013-12-20 NOTE — Telephone Encounter (Signed)
Rxs written and in drawer. Pt notified ready.

## 2013-12-20 NOTE — Telephone Encounter (Signed)
rf for 3 months

## 2013-12-20 NOTE — Telephone Encounter (Signed)
Pt upset that we have not refilled his adderall  Best phone [phone]

## 2014-02-03 ENCOUNTER — Telehealth: Payer: Self-pay

## 2014-02-03 NOTE — Telephone Encounter (Signed)
PA approved for Cialis through 12/01/2038. Notified pharm

## 2014-03-23 ENCOUNTER — Other Ambulatory Visit: Payer: Self-pay | Admitting: Internal Medicine

## 2014-03-25 ENCOUNTER — Telehealth: Payer: Self-pay

## 2014-03-25 NOTE — Telephone Encounter (Signed)
[facility] reqs RF of zolpidem. Will close this because already a RF request encounter forwarded to Dr Elder Cyphers.

## 2014-03-28 ENCOUNTER — Encounter: Payer: PRIVATE HEALTH INSURANCE | Admitting: Internal Medicine

## 2014-03-29 ENCOUNTER — Telehealth: Payer: Self-pay

## 2014-03-29 DIAGNOSIS — G4700 Insomnia, unspecified: Secondary | ICD-10-CM

## 2014-03-29 DIAGNOSIS — Z7189 Other specified counseling: Secondary | ICD-10-CM

## 2014-03-29 NOTE — Telephone Encounter (Signed)
Patient called to check on the status of his refill Ambien 10 mg. Stated he is almost out of his medication. [phone]

## 2014-03-29 NOTE — Telephone Encounter (Signed)
Ok to refill 

## 2014-03-29 NOTE — Telephone Encounter (Signed)
Dr Elder Cyphers, you have an order pending for this under your Rx requests in-basket.

## 2014-03-30 MED ORDER — ZOLPIDEM TARTRATE 10 MG PO TABS: 10.0000 mg | ORAL_TABLET | Freq: Every evening | ORAL | Status: DC | PRN

## 2014-03-30 NOTE — Telephone Encounter (Signed)
Called in RF and notified pt on VM.

## 2014-04-05 ENCOUNTER — Ambulatory Visit (INDEPENDENT_AMBULATORY_CARE_PROVIDER_SITE_OTHER): Payer: BC Managed Care – PPO | Admitting: Internal Medicine

## 2014-04-05 VITALS — BP 126/82 | HR 74 | Temp 97.9°F | Resp 18 | Wt 195.0 lb

## 2014-04-05 DIAGNOSIS — G4700 Insomnia, unspecified: Secondary | ICD-10-CM

## 2014-04-05 DIAGNOSIS — F909 Attention-deficit hyperactivity disorder, unspecified type: Secondary | ICD-10-CM

## 2014-04-05 DIAGNOSIS — R490 Dysphonia: Secondary | ICD-10-CM

## 2014-04-05 DIAGNOSIS — Z7189 Other specified counseling: Secondary | ICD-10-CM

## 2014-04-05 MED ORDER — AMPHETAMINE-DEXTROAMPHET ER 20 MG PO CP24: 20.0000 mg | ORAL_CAPSULE | ORAL | Status: DC

## 2014-04-05 MED ORDER — AMPHETAMINE-DEXTROAMPHET ER 20 MG PO CP24: 20.0000 mg | ORAL_CAPSULE | Freq: Every day | ORAL | Status: DC

## 2014-04-05 MED ORDER — ZOLPIDEM TARTRATE 10 MG PO TABS: 10.0000 mg | ORAL_TABLET | Freq: Every evening | ORAL | Status: DC | PRN

## 2014-04-05 NOTE — Patient Instructions (Signed)
Tadalafil tablets (Cialis) What is this medicine? TADALAFIL (tah DA la fil) is used to treat erection problems in men. It is also used for enlargement of the prostate gland in men, a condition called benign prostatic hyperplasia or BPH. This medicine improves urine flow and reduces BPH symptoms. This medicine can also treat both erection problems and BPH when they occur together. This medicine may be used for other purposes; ask your health care provider or pharmacist if you have questions. COMMON BRAND NAME(S): Cialis What should I tell my health care provider before I take this medicine? They need to know if you have any of these conditions: -bleeding disorders -eye or vision problems, including a rare inherited eye disease called retinitis pigmentosa -anatomical deformation of the penis, Peyronie's disease, or history of priapism (painful and prolonged erection) -heart disease, angina, a history of heart attack, irregular heart beats, or other heart problems -high or low blood pressure -history of blood diseases, like sickle cell anemia or leukemia -history of stomach bleeding -kidney disease -liver disease -stroke -an unusual or allergic reaction to tadalafil, other medicines, foods, dyes, or preservatives -pregnant or trying to get pregnant -breast-feeding How should I use this medicine? Take this medicine by mouth with a glass of water. Follow the directions on the prescription label. You may take this medicine with or without meals. When this medicine is used for erection problems, your doctor may prescribe it to be taken once daily or as needed. If you are taking the medicine as needed, you may be able to have sexual activity 30 minutes after taking it and for up to 36 hours after taking it. Whether you are taking the medicine as needed or once daily, you should not take more than one dose per day. If you are taking this medicine for symptoms of benign prostatic hyperplasia (BPH) or to  treat both BPH and an erection problem, take the dose once daily at about the same time each day. Do not take your medicine more often than directed. Talk to your pediatrician regarding the use of this medicine in children. Special care may be needed. Overdosage: If you think you have taken too much of this medicine contact a poison control center or emergency room at once. NOTE: This medicine is only for you. Do not share this medicine with others. What if I miss a dose? If you are taking this medicine as needed for erection problems, this does not apply. If you miss a dose while taking this medicine once daily for an erection problem, benign prostatic hyperplasia, or both, take it as soon as you remember, but do not take more than one dose per day. What may interact with this medicine? Do not take this medicine with any of the following medications: -nitrates like amyl nitrite, isosorbide dinitrate, isosorbide mononitrate, nitroglycerin -other medicines for erectile dysfunction like avanafil, sildenafil, vardenafil -other tadalafil products (Adcirca)  This medicine may also interact with the following medications: -certain drugs for high blood pressure -certain drugs for the treatment of HIV infection or AIDS -certain drugs used for fungal or yeast infections, like fluconazole, itraconazole, ketoconazole, and voriconazole -certain drugs used for seizures like carbamazepine, phenytoin, and phenobarbital -grapefruit juice -macrolide antibiotics like clarithromycin, erythromycin, troleandomycin -medicines for prostate problems -rifabutin, rifampin or rifapentine This list may not describe all possible interactions. Give your health care provider a list of all the medicines, herbs, non-prescription drugs, or dietary supplements you use. Also tell them if you smoke, drink alcohol, or use  illegal drugs. Some items may interact with your medicine. What should I watch for while using this  medicine? If you notice any changes in your vision while taking this drug, call your doctor or health care professional as soon as possible. Stop using this medicine and call your health care provider right away if you have a loss of sight in one or both eyes. Contact your doctor or health care professional right away if the erection lasts longer than 4 hours or if it becomes painful. This may be a sign of serious problem and must be treated right away to prevent permanent damage. If you experience symptoms of nausea, dizziness, chest pain or arm pain upon initiation of sexual activity after taking this medicine, you should refrain from further activity and call your doctor or health care professional as soon as possible. Do not drink alcohol to excess (examples, 5 glasses of wine or 5 shots of whiskey) when taking this medicine. When taken in excess, alcohol can increase your chances of getting a headache or getting dizzy, increasing your heart rate or lowering your blood pressure. Using this medicine does not protect you or your partner against HIV infection (the virus that causes AIDS) or other sexually transmitted diseases. What side effects may I notice from receiving this medicine? Side effects that you should report to your doctor or health care professional as soon as possible: -allergic reactions like skin rash, itching or hives, swelling of the face, lips, or tongue -breathing problems -changes in hearing -changes in vision -chest pain -fast, irregular heartbeat -prolonged or painful erection -seizures  Side effects that usually do not require medical attention (report to your doctor or health care professional if they continue or are bothersome): -back pain -dizziness -flushing -headache -indigestion -muscle aches -nausea -stuffy or runny nose This list may not describe all possible side effects. Call your doctor for medical advice about side effects. You may report side effects  to FDA at 1-800-FDA-1088. Where should I keep my medicine? Keep out of the reach of children. Store at room temperature between 15 and 30 degrees C (59 and 86 degrees F). Throw away any unused medicine after the expiration date. NOTE: This sheet is a summary. It may not cover all possible information. If you have questions about this medicine, talk to your doctor, pharmacist, or health care provider.  2014, [employer] Standard. (2012-11-18 13:42:23) Zolpidem tablets What is this medicine? ZOLPIDEM (zole PI dem) is used to treat insomnia. This medicine helps you to fall asleep and sleep through the night. This medicine may be used for other purposes; ask your health care provider or pharmacist if you have questions. COMMON BRAND NAME(S): Ambien What should I tell my health care provider before I take this medicine? They need to know if you have any of these conditions: -depression -history of a drug or alcohol abuse problem -liver disease -lung or breathing disease -suicidal thoughts -an unusual or allergic reaction to zolpidem, other medicines, foods, dyes, or preservatives -pregnant or trying to get pregnant -breast-feeding How should I use this medicine? Take this medicine by mouth with a glass of water. Follow the directions on the prescription label. It is better to take this medicine on an empty stomach and only when you are ready for bed. Do not take your medicine more often than directed. If you have been taking this medicine for several weeks and suddenly stop taking it, you may get unpleasant withdrawal symptoms. Your doctor or health care professional may  want to gradually reduce the dose. Do not stop taking this medicine on your own. Always follow your doctor or health care professional's advice. A special MedGuide will be given to you by the pharmacist with each prescription and refill. Be sure to read this information carefully each time. Talk to your pediatrician regarding  the use of this medicine in children. Special care may be needed. Overdosage: If you think you have taken too much of this medicine contact a poison control center or emergency room at once. NOTE: This medicine is only for you. Do not share this medicine with others. What if I miss a dose? This does not apply. This medicine should only be taken immediately before going to sleep. Do not take double or extra doses. What may interact with this medicine? -herbal medicines like kava kava, melatonin, St. John's wort and valerian -medicines for fungal infections like ketoconazole, fluconazole, or itraconazole -medicines for treating depression or other mental problems -other medicines given for sleep -some medicines for Parkinson' s disease or other movement disorders -some medicines used to treat HIV infection or AIDS, like ritonavir This list may not describe all possible interactions. Give your health care provider a list of all the medicines, herbs, non-prescription drugs, or dietary supplements you use. Also tell them if you smoke, drink alcohol, or use illegal drugs. Some items may interact with your medicine. What should I watch for while using this medicine? Visit your doctor or health care professional for regular checks on your progress. Keep a regular sleep schedule by going to bed at about the same time each night. Avoid caffeine-containing drinks in the evening hours. When sleep medicines are used every night for more than a few weeks, they may stop working. Talk to your doctor if you still have trouble sleeping. Do not take this medicine unless you are able to get a full night's sleep before you must be active again. You may not be able to remember things that you do in the hours after you take this medicine. Some people have reported driving, making phone calls, or preparing and eating food while asleep after taking sleep medicine. Take this medicine right before going to sleep. Tell your  doctor if you are have any problems with your memory. After you stop taking this medicine, you may have trouble falling asleep. This is called rebound insomnia. This problem usually goes away on its own after 1 or 2 nights. You may get drowsy or dizzy. Do not drive, use machinery, or do anything that needs mental alertness until you know how this medicine affects you. Do not stand or sit up quickly, especially if you are an older patient. This reduces the risk of dizzy or fainting spells. Alcohol may interfere with the effect of this medicine. Avoid alcoholic drinks. This medicine may cause a decrease in mental alertness the morning after use, even if you feel that you are fully awake. Tell your doctor if you will need to perform activities requiring full alertness, such as driving, the next morning after you have taken this medicine. If you or your family notice any changes in your behavior, or if you have any unusual or disturbing thoughts, call your doctor right away. What side effects may I notice from receiving this medicine? Side effects that you should report to your doctor or health care professional as soon as possible: -allergic reactions like skin rash, itching or hives, swelling of the face, lips, or tongue -changes in vision -confusion -depressed mood -  feeling faint or lightheaded, falls -hallucinations -problems with balance, speaking, walking -restlessness, excitability, or feelings of agitation -unusual activities while asleep like driving, eating, making phone calls Side effects that usually do not require medical attention (report to your doctor or health care professional if they continue or are bothersome): -diarrhea -dizziness, or daytime drowsiness, sometimes called a hangover effect -headache This list may not describe all possible side effects. Call your doctor for medical advice about side effects. You may report side effects to FDA at 1-800-FDA-1088. Where should I keep  my medicine? Keep out of the reach of children. This medicine can be abused. Keep your medicine in a safe place to protect it from theft. Do not share this medicine with anyone. Selling or giving away this medicine is dangerous and against the law. Store at room temperature between 20 and 25 degrees C (68 and 77 degrees F). Throw away any unused medicine after the expiration date. NOTE: This sheet is a summary. It may not cover all possible information. If you have questions about this medicine, talk to your doctor, pharmacist, or health care provider.  2014, [employer]vier/Gold Standard. (2012-11-03 16:54:48) Amphetamine; Dextroamphetamine extended-release capsules What is this medicine? AMPHETAMINE; DEXTROAMPHETAMINE (am FET a meen; dex troe am FET a meen) is used to treat attention-deficit hyperactivity disorder (ADHD). Federal law prohibits giving this medicine to any person other than the person for whom it was prescribed. Do not share this medicine with anyone else. This medicine may be used for other purposes; ask your health care provider or pharmacist if you have questions. COMMON BRAND NAME(S): Adderall XR What should I tell my health care provider before I take this medicine? They need to know if you have any of these conditions: -anxiety or panic attacks -circulation problems in fingers and toes -glaucoma -hardening or blockages of the arteries or heart blood vessels -heart disease or a heart defect -high blood pressure -history of a drug or alcohol abuse problem -history of stroke -kidney disease -liver disease -mental illness -seizures -suicidal thoughts, plans, or attempt; a previous suicide attempt by you or a family member -thyroid disease -Tourette's syndrome -an unusual or allergic reaction to dextroamphetamine, other amphetamines, other medicines, foods, dyes, or preservatives -pregnant or trying to get pregnant -breast-feeding How should I use this medicine? Take this  medicine by mouth with a glass of water. Follow the directions on the prescription label. This medicine is taken just one time per day, usually in the morning after waking up. Take with or without food. Do not chew or crush this medicine. You may open the capsules and sprinkle the medicine onto a spoon of applesauce. If sprinkled on applesauce, take the dose immediately and do not crush or chew. Always drink a glass of water or other liquid after taking this medicine. Do not take your medicine more often than directed. A special MedGuide will be given to you by the pharmacist with each prescription and refill. Be sure to read this information carefully each time. Talk to your pediatrician regarding the use of this medicine in children. While this drug may be prescribed for children as young as 6 years for selected conditions, precautions do apply. Overdosage: If you think you have taken too much of this medicine contact a poison control center or emergency room at once. NOTE: This medicine is only for you. Do not share this medicine with others. What if I miss a dose? If you miss a dose, take it as soon as you can.  If it is almost time for your next dose, take only that dose. Do not take double or extra doses. What may interact with this medicine? Do not take this medicine with any of the following medications: -certain medicines for migraine headache like almotriptan, eletriptan, frovatriptan, naratriptan, rizatriptan, sumatriptan, zolmitriptan -MAOIs like Carbex, Eldepryl, Marplan, Nardil, and Parnate -melatonin -meperidine -other stimulant medicines for attention disorders, weight loss, or to stay awake -pimozide -procarbazine This medicine may also interact with the following medications: -acetazolamide -ammonium chloride -antacids -ascorbic acid -atomoxetine -caffeine -certain medicines for blood pressure -certain medicines for depression, anxiety, or psychotic disturbances -certain  medicines for diabetes -certain medicines for seizures like carbamazepine, phenobarbital, phenytoin -certain medicines for stomach problems like cimetidine, famotidine, omeprazole, lansoprazole -cold or allergy medicines -glutamic acid -methenamine -narcotic medicines for pain -norepinephrine -phenothiazines like chlorpromazine, mesoridazine, prochlorperazine, thioridazine -sodium acid phosphate -sodium bicarbonate This list may not describe all possible interactions. Give your health care provider a list of all the medicines, herbs, non-prescription drugs, or dietary supplements you use. Also tell them if you smoke, drink alcohol, or use illegal drugs. Some items may interact with your medicine. What should I watch for while using this medicine? Visit your doctor or health care professional for regular checks on your progress. This prescription requires that you follow special procedures with your doctor and pharmacy. You will need to have a new written prescription from your doctor every time you need a refill. This medicine may affect your concentration, or hide signs of tiredness. Until you know how this medicine affects you, do not drive, ride a bicycle, use machinery, or do anything that needs mental alertness. Tell your doctor or health care professional if this medicine loses its effects, or if you feel you need to take more than the prescribed amount. Do not change the dosage without talking to your doctor or health care professional. Decreased appetite is a common side effect when starting this medicine. Eating small, frequent meals or snacks can help. Talk to your doctor if you continue to have poor eating habits. Height and weight growth of a child taking this medicine will be monitored closely. Do not take this medicine close to bedtime. It may prevent you from sleeping. If you are going to need surgery, an MRI, a CT scan, or other procedure, tell your doctor that you are taking this  medicine. You may need to stop taking this medicine before the procedure. Tell your doctor or healthcare professional right away if you notice unexplained wounds on your fingers and toes while taking this medicine. You should also tell your healthcare provider if you experience numbness or pain, changes in the skin color, or sensitivity to temperature in your fingers or toes. What side effects may I notice from receiving this medicine? Side effects that you should report to your doctor or health care professional as soon as possible: -allergic reactions like skin rash, itching or hives, swelling of the face, lips, or tongue -changes in vision -chest pain or chest tightness -confusion, trouble speaking or understanding -fast, irregular heartbeat -fingers or toes feel numb, cool, painful -hallucination, loss of contact with reality -high blood pressure -males: prolonged or painful erection -seizures -severe headaches -shortness of breath -suicidal thoughts or other mood changes -trouble walking, dizziness, loss of balance or coordination -uncontrollable head, mouth, neck, arm, or leg movements  Side effects that usually do not require medical attention (report to your doctor or health care professional if they continue or are bothersome): -anxious -headache -  loss of appetite -nausea, vomiting -trouble sleeping -weight loss This list may not describe all possible side effects. Call your doctor for medical advice about side effects. You may report side effects to FDA at 1-800-FDA-1088. Where should I keep my medicine? Keep out of the reach of children. This medicine can be abused. Keep your medicine in a safe place to protect it from theft. Do not share this medicine with anyone. Selling or giving away this medicine is dangerous and against the law. Store at room temperature between 15 and 30 degrees C (59 and 86 degrees F). Keep container tightly closed. Protect from light. Throw away any  unused medicine after the expiration date. NOTE: This sheet is a summary. It may not cover all possible information. If you have questions about this medicine, talk to your doctor, pharmacist, or health care provider.  2014, [employer]vier/Gold Standard. (2013-08-09 13:36:47)

## 2014-04-05 NOTE — Progress Notes (Signed)
   Subjective:    Patient ID: [name], male    DOB: [birth year], 58 y.o.   MRN: [id number]  HPI    Review of Systems     Objective:   Physical Exam        Assessment & Plan:

## 2014-04-05 NOTE — Progress Notes (Signed)
   Subjective:    Patient ID: [name], male    DOB: [birth year], 58 y.o.   MRN: [id number]  HPI Patient presents for medication renewal. States he needs renewal of Adderall XR and Ambien 10mg . States he has been well. Has had recent right shoulder replacement,by Dr Netta Cedars, he is improving overall with this. He is regaining function and motion.  Continues to smoke. Shoulder feels much better.  Review of Systems     Objective:   Physical Exam  Constitutional: He is oriented to person, place, and time. He appears well-developed and well-nourished. No distress.  HENT:  Head: Normocephalic.  Eyes: EOM are normal.  Cardiovascular: Normal rate, regular rhythm and normal heart sounds.   Pulmonary/Chest: Effort normal and breath sounds normal.  Abdominal: Soft.  Musculoskeletal: He exhibits tenderness.       Right shoulder: He exhibits decreased range of motion and tenderness. He exhibits no swelling, no pain, no spasm, normal pulse and normal strength.  Recovering from shoulder replacement  Neurological: He is alert and oriented to person, place, and time. He exhibits normal muscle tone. Coordination normal.  Psychiatric: He has a normal mood and affect. His behavior is normal. Judgment and thought content normal.    Healthy appearing 58 y.o. Male in no distress, seated comfortably on exam table, moves easily around room.       Assessment & Plan:  Encouraged to quit smoking and try to limit alcohol intake Meds ordered this encounter  Medications  . amphetamine-dextroamphetamine (ADDERALL XR) 20 MG 24 hr capsule    Sig: Take 1 capsule (20 mg total) by mouth daily.    Dispense:  30 capsule    Refill:  0    Fill 30 days after date of RX  . amphetamine-dextroamphetamine (ADDERALL XR) 20 MG 24 hr capsule    Sig: Take 1 capsule (20 mg total) by mouth daily.    Dispense:  30 capsule    Refill:  0    Fill 60 days after date of RX  . zolpidem (AMBIEN) 10 MG tablet    Sig: Take  1 tablet (10 mg total) by mouth at bedtime as needed for sleep.    Dispense:  90 tablet    Refill:  0  . amphetamine-dextroamphetamine (ADDERALL XR) 20 MG 24 hr capsule    Sig: Take 1 capsule (20 mg total) by mouth every morning.    Dispense:  30 capsule    Refill:  0

## 2014-06-17 ENCOUNTER — Other Ambulatory Visit: Payer: Self-pay | Admitting: Internal Medicine

## 2014-06-20 NOTE — Telephone Encounter (Signed)
Ok to rf zolpidem, rec use 1/2 tab at night to sleep.

## 2014-06-21 NOTE — Telephone Encounter (Signed)
Faxed Rx. Advised pt on VM Dr Elder Cyphers recommends 1/2 tab per night.

## 2014-08-23 ENCOUNTER — Telehealth: Payer: Self-pay

## 2014-08-23 DIAGNOSIS — F908 Attention-deficit hyperactivity disorder, other type: Secondary | ICD-10-CM

## 2014-08-23 DIAGNOSIS — G4700 Insomnia, unspecified: Secondary | ICD-10-CM

## 2014-08-23 DIAGNOSIS — Z7189 Other specified counseling: Secondary | ICD-10-CM

## 2014-08-23 DIAGNOSIS — N5232 Erectile dysfunction following radical cystectomy: Secondary | ICD-10-CM

## 2014-08-23 NOTE — Telephone Encounter (Signed)
Needs to return to clinic

## 2014-08-23 NOTE — Telephone Encounter (Signed)
Transferred pt to schedule an appt.

## 2014-08-23 NOTE — Telephone Encounter (Signed)
Pt called last week he says on Friday asking about refills.  I do not see any documentation for this, but I do believe he called. He is requesting refills on his ambien, adderall and cialis.  He sees Dr. Elder Cyphers and has been awaiting a call back about this since last week.  Says he has about one weeks worth of the medications, but he knows to call early.  He would like to get these filled without having to come in, but he will come in if necessary.  He was last here in May.  [phone]

## 2014-09-02 ENCOUNTER — Ambulatory Visit (INDEPENDENT_AMBULATORY_CARE_PROVIDER_SITE_OTHER): Payer: BC Managed Care – PPO | Admitting: Internal Medicine

## 2014-09-02 VITALS — BP 110/77 | HR 71 | Temp 97.8°F | Resp 18 | Wt 196.0 lb

## 2014-09-02 DIAGNOSIS — Z7189 Other specified counseling: Secondary | ICD-10-CM

## 2014-09-02 DIAGNOSIS — F908 Attention-deficit hyperactivity disorder, other type: Secondary | ICD-10-CM

## 2014-09-02 DIAGNOSIS — D649 Anemia, unspecified: Secondary | ICD-10-CM

## 2014-09-02 DIAGNOSIS — F909 Attention-deficit hyperactivity disorder, unspecified type: Secondary | ICD-10-CM

## 2014-09-02 DIAGNOSIS — G4700 Insomnia, unspecified: Secondary | ICD-10-CM

## 2014-09-02 DIAGNOSIS — N528 Other male erectile dysfunction: Secondary | ICD-10-CM

## 2014-09-02 LAB — COMPREHENSIVE METABOLIC PANEL
ALBUMIN: 4.1 g/dL (ref 3.5–5.2)
ALT: 22 U/L (ref 0–53)
AST: 21 U/L (ref 0–37)
Alkaline Phosphatase: 69 U/L (ref 39–117)
BUN: 10 mg/dL (ref 6–23)
CALCIUM: 8.8 mg/dL (ref 8.4–10.5)
CO2: 28 mEq/L (ref 19–32)
Chloride: 102 mEq/L (ref 96–112)
Creat: 0.94 mg/dL (ref 0.50–1.35)
Glucose, Bld: 103 mg/dL — ABNORMAL HIGH (ref 70–99)
POTASSIUM: 4.2 meq/L (ref 3.5–5.3)
Sodium: 140 mEq/L (ref 135–145)
Total Bilirubin: 0.9 mg/dL (ref 0.2–1.2)
Total Protein: 6.6 g/dL (ref 6.0–8.3)

## 2014-09-02 LAB — POCT CBC
GRANULOCYTE PERCENT: 74.7 % (ref 37–80)
HCT, POC: 45.8 % (ref 43.5–53.7)
Hemoglobin: 15.5 g/dL (ref 14.1–18.1)
Lymph, poc: 1.8 (ref 0.6–3.4)
MCH, POC: 30.3 pg (ref 27–31.2)
MCHC: 33.9 g/dL (ref 31.8–35.4)
MCV: 89.3 fL (ref 80–97)
MID (CBC): 0.2 (ref 0–0.9)
MPV: 6.7 fL (ref 0–99.8)
PLATELET COUNT, POC: 170 10*3/uL (ref 142–424)
POC Granulocyte: 5.8 (ref 2–6.9)
POC LYMPH PERCENT: 23.2 %L (ref 10–50)
POC MID %: 2.1 % (ref 0–12)
RBC: 5.12 M/uL (ref 4.69–6.13)
RDW, POC: 13.4 %
WBC: 7.8 10*3/uL (ref 4.6–10.2)

## 2014-09-02 LAB — LIPID PANEL
Cholesterol: 170 mg/dL (ref 0–200)
HDL: 53 mg/dL (ref >39)
LDL CALC: 98 mg/dL (ref 0–99)
TRIGLYCERIDES: 93 mg/dL (ref <150)
Total CHOL/HDL Ratio: 3.2 Ratio
VLDL: 19 mg/dL (ref 0–40)

## 2014-09-02 LAB — TSH: TSH: 0.829 u[IU]/mL (ref 0.350–4.500)

## 2014-09-02 MED ORDER — TADALAFIL 20 MG PO TABS: 20.0000 mg | ORAL_TABLET | Freq: Every day | ORAL | Status: DC | PRN

## 2014-09-02 MED ORDER — ZOLPIDEM TARTRATE 10 MG PO TABS: 10.0000 mg | ORAL_TABLET | Freq: Every evening | ORAL | Status: DC | PRN

## 2014-09-02 MED ORDER — AMPHETAMINE-DEXTROAMPHET ER 20 MG PO CP24: 20.0000 mg | ORAL_CAPSULE | ORAL | Status: DC

## 2014-09-02 NOTE — Patient Instructions (Signed)
Throat Cancer Throat cancer (oropharyngeal cancer) is an abnormal growth that often develops on the mucosal (inner) surfaces of the larynx, pharynx, or mouth. The oropharynx is the middle part of the pharynx (throat) behind the mouth. It includes the back one-third of the tongue, the soft palate, the side and back walls of the throat, and the tonsils. The pharynx starts behind the nose and ends at the top of the trachea (windpipe) and esophagus (the tube that goes from the throat to the stomach). Food passes through the pharynx on the way to the esophagus. Air passes through on the way to the trachea. Cancer of the throat is cancer of the vocal cords, voice box (larynx), or other areas of the throat. Most throat cancers are the squamous cell type. Squamous cells are the thin, flat cells that line the inside of the oropharynx. Smoking cigarettes and abusing alcohol can increase a person's risk for throat cancer. Throat cancers usually develop around age 60, and men are 10 times more likely to develop them than women. There are many new surgical techniques to remove the cancer while keeping the function and cosmetic appearance unharmed as much as is possible. Advances in radiation therapy and other oncology services continue to improve treatment. RISK FACTORS Anything that increases your risk of getting a disease is called a risk factor. Risk factors include the following:  Smoking, drinking alcohol, and chewing tobacco all increase the risk.  Becoming infected with human papilloma virus (HPV).  Eating a diet low in fruits and vegetables.  Drinking Marlon Pel, an herbal stimulant used to make a drink similar to tea. This drink is common in [place].  Chewing betel quid, a stimulant (areca nut) which is chewed. The habit of chewing betel quid is widespread in [place], in the [place], and in people of [place] origin elsewhere in the world.  Any of the above habits which may have  contributed to your problem should be stopped. Your caregiver can offer you help to stop various harmful habits. SYMPTOMS  Possible signs of throat cancer include:  A sore throat, neck pain or lumps in the neck that do not get better within a couple weeks.  A sore throat that does not go away.  Hoarseness that does not get better in a couple weeks.  Pain behind the breastbone.  Cough and may include coughing up blood and abnormal (high-pitched) breathing sounds.  Trouble swallowing (dysphagia).  Weight loss for no known reason.  Fatigue and generalized tiredness.  Ear pain.  A growth in the back of the mouth, throat, or neck. DIAGNOSIS   Your caregiver will examine your throat using a mirror or fiberoptic instrument called a laryngoscope. This examination (laryngoscopy) uses a tube with a small, lighted camera. It allows your caregiver to look into the mouth and down the throat to look for a tumor.  To properly diagnose and stage throat cancer, your caregivers may take biopsies (tissue samples) from the growth. Specialized studies such as X-rays, ultrasound images, magnetic resonance imaging (MRI) scans, PET scans and computed tomography (CT) scans may be done. Your caregiver will discuss these with you.  Your treatment will ultimately depend on the stage (how far it has spread) and the grade (this is how fast the cancer is growing). It also depends on the location of the cancer and your health. All of these factors also affect the prognosis (what can be expected to happen). TREATMENT  Your caregivers will consider the type  and stage of your throat cancer, general health, and special considerations before helping you choose a treatment plan. Specialized surgery, radiation, chemotherapy and rehabilitative services are available for treatment options. Treatment is aimed at getting rid of the cancer and preventing spread to other parts of the body.   Small tumors can be treated with  either surgery (removed through an endoscope [tube] passed through the mouth to the cancer) or radiation therapy alone to get rid of the tumor. Larger tumors or those which have spread to lymph nodes in the neck may need a combination of radiation and chemotherapy. This may be used to preserve the voice box and is usually successful.  Removing the tumor with surgery (laryngectomy), including all or part of the vocal cords, is necessary in some cases. When laryngectomy is required, a surgical prosthesis (artificial vocal cords) may be implanted, voice aids may be used, or speech therapy may be recommended to teach alternative methods of speaking. Also when the entire voice box must be removed (total laryngectomy), your surgeon may connect the windpipe to the front of the neck. This allows you to breathe. This hole through which you breathe is called a tracheostomy.  Chemotherapy may be used when tumors have spread too far for surgery or radiation therapy to be successful. Chemotherapy is also used to enhance the effectiveness of radiation therapy. It may improve survival and help preserve the larynx.  Many patients also need swallowing therapy after treatment to help them adjust to the changes in the structure of the throat.  Joining a support group may be helpful. This group would include people who share common experiences and problems. Your caregivers can direct you in the correct direction to join one of these. RISKS AND COMPLICATIONS The following are problems which can happen following surgery and radiation.   Disfigurement of the neck or face.  Hardening and discoloration of the skin overlying the radiation site.  Skin may be red, sensitive, or easily irritated for months following radiation treatment. When radiation is used prior to chemotherapy, the treated skin may turn red, blister, and peel after chemo is given. Loose, soft clothes may help this. Lotions or similar products should not be  used unless directed by your caregiver.  Difficulty swallowing.  Cancer spread.  Airway obstruction.  Loss of voice. SEEK IMMEDIATE MEDICAL CARE IF: Following treatment, seek immediate medical care if you develop:  New onset of hoarseness.  New onset of coughing up blood.  Breaking open of a suture line.  A foul smell coming from the wound or dressing.  You develop a temperature over 101F (38.3C).  Shortness of breath.  Loss of voice.  Bleeding coming from the wound.  Increasing pain or swelling coming from the wound or surrounding area. Document Released: 10/31/2005 Document Revised: 04/04/2014 Document Reviewed: 11/20/2008 [facility] Patient Information 2015 [place], [place]. This information is not intended to replace advice given to you by your health care provider. Make sure you discuss any questions you have with your health care provider. Smoking Cessation Quitting smoking is important to your health and has many advantages. However, it is not always easy to quit since nicotine is a very addictive drug. Oftentimes, people try 3 times or more before being able to quit. This document explains the best ways for you to prepare to quit smoking. Quitting takes hard work and a lot of effort, but you can do it. ADVANTAGES OF QUITTING SMOKING  You will live longer, feel better, and live better.  Your body will feel the impact of quitting smoking almost immediately.  Within 20 minutes, blood pressure decreases. Your pulse returns to its normal level.  After 8 hours, carbon monoxide levels in the blood return to normal. Your oxygen level increases.  After 24 hours, the chance of having a heart attack starts to decrease. Your breath, hair, and body stop smelling like smoke.  After 48 hours, damaged nerve endings begin to recover. Your sense of taste and smell improve.  After 72 hours, the body is virtually free of nicotine. Your bronchial tubes relax and breathing becomes  easier.  After 2 to 12 weeks, lungs can hold more air. Exercise becomes easier and circulation improves.  The risk of having a heart attack, stroke, cancer, or lung disease is greatly reduced.  After 1 year, the risk of coronary heart disease is cut in half.  After 5 years, the risk of stroke falls to the same as a nonsmoker.  After 10 years, the risk of lung cancer is cut in half and the risk of other cancers decreases significantly.  After 15 years, the risk of coronary heart disease drops, usually to the level of a nonsmoker.  If you are pregnant, quitting smoking will improve your chances of having a healthy baby.  The people you live with, especially any children, will be healthier.  You will have extra money to spend on things other than cigarettes. QUESTIONS TO THINK ABOUT BEFORE ATTEMPTING TO QUIT You may want to talk about your answers with your health care provider.  Why do you want to quit?  If you tried to quit in the past, what helped and what did not?  What will be the most difficult situations for you after you quit? How will you plan to handle them?  Who can help you through the tough times? Your family? Friends? A health care provider?  What pleasures do you get from smoking? What ways can you still get pleasure if you quit? Here are some questions to ask your health care provider:  How can you help me to be successful at quitting?  What medicine do you think would be best for me and how should I take it?  What should I do if I need more help?  What is smoking withdrawal like? How can I get information on withdrawal? GET READY  Set a quit date.  Change your environment by getting rid of all cigarettes, ashtrays, matches, and lighters in your home, car, or work. Do not let people smoke in your home.  Review your past attempts to quit. Think about what worked and what did not. GET SUPPORT AND ENCOURAGEMENT You have a better chance of being successful if  you have help. You can get support in many ways.  Tell your family, friends, and coworkers that you are going to quit and need their support. Ask them not to smoke around you.  Get individual, group, or telephone counseling and support. Programs are available at [employer] and health centers. Call your local health department for information about programs in your area.  Spiritual beliefs and practices may help some smokers quit.  Download a "quit meter" on your computer to keep track of quit statistics, such as how long you have gone without smoking, cigarettes not smoked, and money saved.  Get a self-help book about quitting smoking and staying off tobacco. [place] yourself from urges to smoke. Talk to someone, go for a walk,  or occupy your time with a task.  Change your normal routine. Take a different route to work. Drink tea instead of coffee. Eat breakfast in a different place.  Reduce your stress. Take a hot bath, exercise, or read a book.  Plan something enjoyable to do every day. Reward yourself for not smoking.  Explore interactive web-based programs that specialize in helping you quit. GET MEDICINE AND USE IT CORRECTLY Medicines can help you stop smoking and decrease the urge to smoke. Combining medicine with the above behavioral methods and support can greatly increase your chances of successfully quitting smoking.  Nicotine replacement therapy helps deliver nicotine to your body without the negative effects and risks of smoking. Nicotine replacement therapy includes nicotine gum, lozenges, inhalers, nasal sprays, and skin patches. Some may be available over-the-counter and others require a prescription.  Antidepressant medicine helps people abstain from smoking, but how this works is unknown. This medicine is available by prescription.  Nicotinic receptor partial agonist medicine simulates the effect of nicotine in your brain. This  medicine is available by prescription. Ask your health care provider for advice about which medicines to use and how to use them based on your health history. Your health care provider will tell you what side effects to look out for if you choose to be on a medicine or therapy. Carefully read the information on the package. Do not use any other product containing nicotine while using a nicotine replacement product.  RELAPSE OR DIFFICULT SITUATIONS Most relapses occur within the first 3 months after quitting. Do not be discouraged if you start smoking again. Remember, most people try several times before finally quitting. You may have symptoms of withdrawal because your body is used to nicotine. You may crave cigarettes, be irritable, feel very hungry, cough often, get headaches, or have difficulty concentrating. The withdrawal symptoms are only temporary. They are strongest when you first quit, but they will go away within 10-14 days. To reduce the chances of relapse, try to:  Avoid drinking alcohol. Drinking lowers your chances of successfully quitting.  Reduce the amount of caffeine you consume. Once you quit smoking, the amount of caffeine in your body increases and can give you symptoms, such as a rapid heartbeat, sweating, and anxiety.  Avoid smokers because they can make you want to smoke.  Do not let weight gain distract you. Many smokers will gain weight when they quit, usually less than 10 pounds. Eat a healthy diet and stay active. You can always lose the weight gained after you quit.  Find ways to improve your mood other than smoking. FOR MORE INFORMATION  www.smokefree.gov  Document Released: 11/12/2001 Document Revised: 04/04/2014 Document Reviewed: 02/27/2012 [facility] Patient Information 2015 [place], [place]. This information is not intended to replace advice given to you by your health care provider. Make sure you discuss any questions you have with your health care provider.

## 2014-09-02 NOTE — Progress Notes (Signed)
   Subjective:    Patient ID: [name], male    DOB: 25-Jan-1956, 58 y.o.   MRN: [id number]  HPI A 58 year old male is here with an request to have his medication refilled.  Patient would like to have Ambien, Cialis, and Adderall refilled.  Patient states that is he doing ok with each medication and has not had any side effects that hinder his daily activities.  Patient recently had surgery on his right shoulder in November 2014 by Dr. Hassell Done. He mentions that his recovery was great with no complications.  Patient also mentions he had polyps that were causing him to be hoarse.  He has been seen that issue. However, he states as of now his overall health is fine other than he is an current smoker.  He denies have any other symptoms such as fever, chills, cold symptoms.    Review of Systems     Objective:   Physical Exam  Constitutional: He is oriented to person, place, and time. He appears well-developed and well-nourished. No distress.  HENT:  Head: Normocephalic.  Right Ear: External ear normal.  Left Ear: External ear normal.  Nose: Nose normal.  Mouth/Throat: Uvula is midline. Posterior oropharyngeal erythema present.  Chronic hoarseness Hx polps removed Still smokes  Eyes: Conjunctivae and EOM are normal. Pupils are equal, round, and reactive to light.  Neck: Normal range of motion. Neck supple.  Cardiovascular: Normal rate, regular rhythm and normal heart sounds.   Pulmonary/Chest: Effort normal and breath sounds normal.  Abdominal: Soft. Bowel sounds are normal.  Musculoskeletal: Normal range of motion.       Right shoulder: Normal.  Total joint replaced right shoulder  Neurological: He is alert and oriented to person, place, and time. He has normal reflexes. No cranial nerve deficit. He exhibits normal muscle tone. Coordination normal.  Psychiatric: He has a normal mood and affect. His behavior is normal. Judgment and thought content normal.   labs       Assessment &  Plan:  Insomnia/ED/Med reviewADHD Anemia from shoulder surgery RF meds OK to refill adderal for 3 more months after this

## 2014-09-03 LAB — PSA: PSA: 3.34 ng/mL (ref <=4.00)

## 2014-09-05 ENCOUNTER — Encounter: Payer: Self-pay | Admitting: Radiology

## 2014-09-27 ENCOUNTER — Other Ambulatory Visit: Payer: Self-pay | Admitting: Internal Medicine

## 2014-09-28 NOTE — Telephone Encounter (Signed)
Dr Elder Cyphers wrote Rx in 09/02/14 for #90 w/2 RFs. Called pharmacy who reported that they don't have a record of this Rx. I called in as written except only for #90 w 1 RF because controlled can not be refilled more than 6 mos by law and the 2nd RF would have expired.

## 2014-10-18 ENCOUNTER — Telehealth: Payer: Self-pay

## 2014-10-18 NOTE — Telephone Encounter (Signed)
Pt needs to RTC for evaluation. Pt agrees to come in.

## 2014-10-18 NOTE — Telephone Encounter (Signed)
Pt left a vm for Dr. Elder Cyphers on 10/17/2014.  Pt wanted Dr. Elder Cyphers to prescribe him a Zpap due to a chest cold that keeps lingering.   Pt # [id number]

## 2014-10-18 NOTE — Telephone Encounter (Signed)
Pt called to say he have a bad cold and would like DR. Guest to prescribe him a Z-PAK. Please call [place]

## 2014-10-25 ENCOUNTER — Telehealth: Payer: Self-pay | Admitting: *Deleted

## 2014-10-25 NOTE — Telephone Encounter (Signed)
Patient called for Dr. Elder Cyphers. Patient has chest cold for the last month and now having headaches.  Any advice?  Pt # [id number]

## 2014-10-26 NOTE — Telephone Encounter (Signed)
Advised pt to RTC 

## 2014-11-15 ENCOUNTER — Ambulatory Visit (INDEPENDENT_AMBULATORY_CARE_PROVIDER_SITE_OTHER): Payer: BC Managed Care – PPO | Admitting: Internal Medicine

## 2014-11-15 ENCOUNTER — Ambulatory Visit (INDEPENDENT_AMBULATORY_CARE_PROVIDER_SITE_OTHER): Payer: BC Managed Care – PPO

## 2014-11-15 VITALS — BP 122/84 | HR 84 | Temp 97.6°F | Resp 16 | Ht 68.0 in | Wt 194.4 lb

## 2014-11-15 DIAGNOSIS — Z720 Tobacco use: Secondary | ICD-10-CM

## 2014-11-15 DIAGNOSIS — J019 Acute sinusitis, unspecified: Secondary | ICD-10-CM

## 2014-11-15 DIAGNOSIS — R51 Headache: Secondary | ICD-10-CM

## 2014-11-15 DIAGNOSIS — R519 Headache, unspecified: Secondary | ICD-10-CM

## 2014-11-15 DIAGNOSIS — F17200 Nicotine dependence, unspecified, uncomplicated: Secondary | ICD-10-CM

## 2014-11-15 LAB — POCT CBC
GRANULOCYTE PERCENT: 69.1 % (ref 37–80)
HEMATOCRIT: 49.9 % (ref 43.5–53.7)
Hemoglobin: 16.6 g/dL (ref 14.1–18.1)
Lymph, poc: 2.9 (ref 0.6–3.4)
MCH, POC: 29.5 pg (ref 27–31.2)
MCHC: 33.2 g/dL (ref 31.8–35.4)
MCV: 88.8 fL (ref 80–97)
MID (CBC): 0.6 (ref 0–0.9)
MPV: 6.4 fL (ref 0–99.8)
PLATELET COUNT, POC: 209 10*3/uL (ref 142–424)
POC GRANULOCYTE: 7.9 — AB (ref 2–6.9)
POC LYMPH %: 25.7 % (ref 10–50)
POC MID %: 5.2 %M (ref 0–12)
RBC: 5.62 M/uL (ref 4.69–6.13)
RDW, POC: 13.8 %
WBC: 11.4 10*3/uL — AB (ref 4.6–10.2)

## 2014-11-15 LAB — POCT SEDIMENTATION RATE: POCT SED RATE: 16 mm/h (ref 0–22)

## 2014-11-15 MED ORDER — PREDNISONE 10 MG PO TABS: ORAL_TABLET | ORAL | Status: DC

## 2014-11-15 MED ORDER — AMOXICILLIN 500 MG PO CAPS: 1000.0000 mg | ORAL_CAPSULE | Freq: Two times a day (BID) | ORAL | Status: DC

## 2014-11-15 MED ORDER — HYDROCODONE-ACETAMINOPHEN 5-325 MG PO TABS: 1.0000 | ORAL_TABLET | Freq: Four times a day (QID) | ORAL | Status: DC | PRN

## 2014-11-15 NOTE — Progress Notes (Signed)
   Subjective:    Patient ID: [name], male    DOB: 1956/11/25, 58 y.o.   MRN: [id number]  HPI CO HA, over a month now. Has quit smoking finally and feels great. Pain is left mid to posterior, he did have resp. Illness last month. Does have throat and nasal congestion comes and goes.   Review of Systems     Objective:   Physical Exam  Constitutional: He is oriented to person, place, and time. He appears well-developed and well-nourished. No distress.  HENT:  Head: Normocephalic.  Right Ear: External ear normal.  Left Ear: External ear normal.  Nose: Mucosal edema, rhinorrhea, sinus tenderness and septal deviation present. No epistaxis. Right sinus exhibits no maxillary sinus tenderness and no frontal sinus tenderness. Left sinus exhibits maxillary sinus tenderness and frontal sinus tenderness.  Mouth/Throat: Oropharynx is clear and moist.  Eyes: Conjunctivae, EOM and lids are normal. Pupils are equal, round, and reactive to light. Right conjunctiva is not injected. Left conjunctiva is not injected. No scleral icterus.  Fundoscopic exam:      The right eye shows no arteriolar narrowing, no exudate, no hemorrhage and no papilledema.       The left eye shows no arteriolar narrowing, no exudate, no hemorrhage and no papilledema.  Neck: Normal range of motion. Neck supple.  Cardiovascular: Normal rate, regular rhythm and normal heart sounds.   Pulmonary/Chest: Effort normal and breath sounds normal.  Abdominal: Soft. Bowel sounds are normal.  Musculoskeletal: Normal range of motion.  Lymphadenopathy:    He has no cervical adenopathy.  Neurological: He is alert and oriented to person, place, and time. He has normal reflexes. No cranial nerve deficit. He exhibits normal muscle tone. Coordination normal.  Psychiatric: He has a normal mood and affect. His behavior is normal. Judgment and thought content normal.   Results for orders placed or performed in visit on 11/15/14  POCT CBC    Result Value Ref Range   WBC 11.4 (A) 4.6 - 10.2 K/uL   Lymph, poc 2.9 0.6 - 3.4   POC LYMPH PERCENT 25.7 10 - 50 %L   MID (cbc) 0.6 0 - 0.9   POC MID % 5.2 0 - 12 %M   POC Granulocyte 7.9 (A) 2 - 6.9   Granulocyte percent 69.1 37 - 80 %G   RBC 5.62 4.69 - 6.13 M/uL   Hemoglobin 16.6 14.1 - 18.1 g/dL   HCT, POC 49.9 43.5 - 53.7 %   MCV 88.8 80 - 97 fL   MCH, POC 29.5 27 - 31.2 pg   MCHC 33.2 31.8 - 35.4 g/dL   RDW, POC 13.8 %   Platelet Count, POC 209 142 - 424 K/uL   MPV 6.4 0 - 99.8 fL  POCT SEDIMENTATION RATE  Result Value Ref Range   POCT SED RATE  0 - 22 mm/hr     [facility] reading (PRIMARY) by  Dr Elder Cyphers cloudy sinuses, no air fluid, no sella tursica seen?       Assessment & Plan:  Possible sinusitis CT head and sinus Amoxil 1g bid 2 weeks RTC 1-2 weeks if not well

## 2014-11-15 NOTE — Patient Instructions (Signed)

## 2014-11-18 ENCOUNTER — Telehealth: Payer: Self-pay

## 2014-11-18 NOTE — Telephone Encounter (Signed)
He may have some nausea with alcohol, called him to advise. Encouraged him to eat with the antibiotics, drink enough fluids and try to rest. He agrees.

## 2014-11-18 NOTE — Telephone Encounter (Addendum)
Pt has question regarding the dosage of the amoxicillin he was prescribed, he would like to know if he can drink while taking the medication.  [phone]

## 2014-11-21 ENCOUNTER — Other Ambulatory Visit: Payer: BC Managed Care – PPO

## 2014-11-30 ENCOUNTER — Other Ambulatory Visit: Payer: Self-pay | Admitting: Internal Medicine

## 2014-12-12 ENCOUNTER — Telehealth: Payer: Self-pay

## 2014-12-12 ENCOUNTER — Ambulatory Visit (INDEPENDENT_AMBULATORY_CARE_PROVIDER_SITE_OTHER): Payer: BLUE CROSS/BLUE SHIELD | Admitting: Physician Assistant

## 2014-12-12 VITALS — BP 120/84 | HR 84 | Temp 97.8°F | Resp 16 | Ht 68.0 in | Wt 196.0 lb

## 2014-12-12 DIAGNOSIS — R51 Headache: Secondary | ICD-10-CM

## 2014-12-12 DIAGNOSIS — R519 Headache, unspecified: Secondary | ICD-10-CM

## 2014-12-12 DIAGNOSIS — R208 Other disturbances of skin sensation: Secondary | ICD-10-CM

## 2014-12-12 MED ORDER — VALACYCLOVIR HCL 1 G PO TABS: 1000.0000 mg | ORAL_TABLET | Freq: Three times a day (TID) | ORAL | Status: DC

## 2014-12-12 NOTE — Patient Instructions (Signed)
We flushed out your left ear today.  For your facial burning, I've sent in an antiviral med to the pharmacy. If you notice a rash develop over the burning area, please get this filled. You will take it three times per day for 7 days. If the burning or pain gets worse and the rash develops, please let us know and I'll send in a pain medication for this.  I've also referred you to neurology as you have had headaches for 2 months, your left eye is drooping, and you have some pain with movement of your left eye. They'll be in contact with you to schedule this. Please return to clinic if the headaches worsen or if the burning does not go away before you can see neurology.

## 2014-12-12 NOTE — Telephone Encounter (Signed)
Pt needs to be evaluated. Lm to advise.

## 2014-12-12 NOTE — Telephone Encounter (Signed)
Dr.Guest, Pt is having a burning sensation on his face and would like to know if he should come in? [phone]

## 2014-12-12 NOTE — Progress Notes (Signed)
Subjective:    Patient ID: [name], male    DOB: Oct 03, 1956, 59 y.o.   MRN: [id number]  PCP: Kennon Portela, MD  Chief Complaint  Patient presents with  . Headache    Feels like left forehead is sunburned   Patient Active Problem List   Diagnosis Date Noted  . Cough 05/17/2013  . Dyspnea 04/06/2013  . Hoarseness 04/06/2013  . Essential hypertension, benign 04/06/2013  . Abnormal PFT 03/15/2013  . AV block, 1st degree 03/15/2013  . Insomnia 10/19/2012  . ADHD (attention deficit hyperactivity disorder) 05/04/2012  . Smoker 03/16/2012   Prior to Admission medications   Medication Sig Start Date End Date Taking? Authorizing Provider  amphetamine-dextroamphetamine (ADDERALL XR) 20 MG 24 hr capsule Take 1 capsule (20 mg total) by mouth every morning. 09/02/14  Yes Orma Flaming, MD  aspirin 81 MG tablet Take 81 mg by mouth every other day.   Yes Historical Provider, MD  tadalafil (CIALIS) 20 MG tablet Take 1 tablet (20 mg total) by mouth daily as needed for erectile dysfunction. 09/02/14  Yes Orma Flaming, MD  zolpidem (AMBIEN) 10 MG tablet Take 1 tablet (10 mg total) by mouth at bedtime as needed for sleep. 09/02/14  Yes Orma Flaming, MD   Medications, allergies, past medical history, surgical history, family history, social history and problem list reviewed and updated.  HPI  31 yom with pmh add, insomnia, and htn returns to clinic for HA.  Was seen here one month ago with left sided HAs. Had been getting them for one month at that time. He was diagnosed with sinusitis, sinus xray done, referred for sinus ct and given amoxicillin scrip. He was also given mucinex-d which helped a bit. Had normal esr at that visit.   Today he returns stating the HAs did resolve the amox for 2 wks, but then have been back again for 2 wks. HAs are intermittent, typically come on around 8pm each evening and last until he goes to bed at midnight. Wakes up without HA. He has been getting these  every 2-3 nights over past 2 wks. HAs are located left temporal area. No radiation. He denies any eye tearing, vision changes, photo/phonophobia, numbness, or weakness. Denies vertigo or tinnitus. HA does not worsen with bending fwd.   He states he did not get the sinus ct done as it was too expensive. He denies any uri sx such as cough, sore throat, rhinorrhea, otalgia.   Received shingles vaccine 1.5 yrs ago.   He has been having mild pain with eye movements past 2 months. No specific eye movements bring it on. Just in general is best he can describe.   Review of Systems No CP, SOB, fever, chills.     Objective:   Physical Exam  Constitutional: He is oriented to person, place, and time. He appears well-developed and well-nourished.  Non-toxic appearance. He does not have a sickly appearance. He does not appear ill. No distress.  BP 120/84 mmHg  Pulse 84  Temp(Src) 97.8 F (36.6 C) (Oral)  Resp 16  Ht 5' 8"  (1.727 m)  Wt 196 lb (88.905 kg)  BMI 29.81 kg/m2  SpO2 97%   HENT:  Right Ear: Tympanic membrane normal.  Left Ear: Tympanic membrane normal.  Nose: Nose normal. Right sinus exhibits no maxillary sinus tenderness and no frontal sinus tenderness. Left sinus exhibits no maxillary sinus tenderness and no frontal sinus tenderness.  Mouth/Throat: Uvula is midline and oropharynx  is clear and moist. No oropharyngeal exudate, posterior oropharyngeal edema, posterior oropharyngeal erythema or tonsillar abscesses.  Left ear with cerumen impaction. TM clear post-flushing in clinic today.   Eyes: Conjunctivae and EOM are normal. Pupils are equal, round, and reactive to light.  Left eye slightly drooped compared to right. Able to wrinkle forehead bilaterally.   Neck: No Brudzinski's sign noted.  Pulmonary/Chest: Effort normal and breath sounds normal. He has no decreased breath sounds. He has no wheezes. He has no rhonchi. He has no rales.  Neurological: He is alert and oriented to person,  place, and time. He has normal strength. No cranial nerve deficit or sensory deficit.  Normal sensation left side face. No TTP over left eye, left temporal area.   Skin: No rash noted.  Psychiatric: He has a normal mood and affect. His speech is normal.      Assessment & Plan:   61 yom with pmh add, insomnia, and htn returns to clinic for HA.  Facial burning - Plan: valACYclovir (VALTREX) 1000 MG tablet, Ambulatory referral to Neurology Nonintractable episodic headache, unspecified headache type - Plan: Ambulatory referral to Neurology --has had shingles vaccine but sx could still be due to early shingles --no rash yet so sent valtrex to pharmacy with instructions to pick up if rash develops --pt to call clinic for pain meds if rash develops and fills valtrex --neuro referral for ongoing left sided HA, slight left eye drooping, mild pain with left eye movements --has already had normal esr, neuro exam normal  --rtc prior to neuro visit if HAs worsen, burning persists despite valtrex or with any numbness/weakness  Julieta Gutting, PA-C Physician Assistant-Certified [facility]  12/12/2014 6:46 PM

## 2014-12-15 ENCOUNTER — Telehealth: Payer: Self-pay

## 2014-12-15 DIAGNOSIS — F909 Attention-deficit hyperactivity disorder, unspecified type: Secondary | ICD-10-CM

## 2014-12-15 DIAGNOSIS — F908 Attention-deficit hyperactivity disorder, other type: Secondary | ICD-10-CM

## 2014-12-15 DIAGNOSIS — N528 Other male erectile dysfunction: Secondary | ICD-10-CM

## 2014-12-15 DIAGNOSIS — Z7189 Other specified counseling: Secondary | ICD-10-CM

## 2014-12-15 DIAGNOSIS — G4700 Insomnia, unspecified: Secondary | ICD-10-CM

## 2014-12-15 DIAGNOSIS — D649 Anemia, unspecified: Secondary | ICD-10-CM

## 2014-12-15 NOTE — Telephone Encounter (Signed)
Pt would like to have a refill on ambiene. [phone]

## 2014-12-17 NOTE — Telephone Encounter (Signed)
Ok to refill 

## 2014-12-19 MED ORDER — ZOLPIDEM TARTRATE 10 MG PO TABS: 10.0000 mg | ORAL_TABLET | Freq: Every evening | ORAL | Status: DC | PRN

## 2014-12-19 NOTE — Telephone Encounter (Signed)
Called in RF of zolpidem and notified pt

## 2014-12-22 ENCOUNTER — Telehealth: Payer: Self-pay

## 2014-12-22 DIAGNOSIS — F908 Attention-deficit hyperactivity disorder, other type: Secondary | ICD-10-CM

## 2014-12-22 DIAGNOSIS — Z7189 Other specified counseling: Secondary | ICD-10-CM

## 2014-12-22 NOTE — Telephone Encounter (Signed)
Pt is reuqesting refill on adderall

## 2014-12-23 NOTE — Telephone Encounter (Signed)
OK to refill for 3 months. 

## 2014-12-25 MED ORDER — AMPHETAMINE-DEXTROAMPHET ER 20 MG PO CP24: 20.0000 mg | ORAL_CAPSULE | ORAL | Status: DC

## 2014-12-25 NOTE — Telephone Encounter (Signed)
Pt advised. rx in pick up drawer.

## 2014-12-25 NOTE — Telephone Encounter (Signed)
Rx's printed/signed on Dr. Luiz Ochoa behalf per his order.  Meds ordered this encounter  Medications  . amphetamine-dextroamphetamine (ADDERALL XR) 20 MG 24 hr capsule    Sig: Take 1 capsule (20 mg total) by mouth every morning.    Dispense:  30 capsule    Refill:  0    Order Specific Question:  Supervising Provider    Answer:  DOOLITTLE, ROBERT P [[id number]]  . amphetamine-dextroamphetamine (ADDERALL XR) 20 MG 24 hr capsule    Sig: Take 1 capsule (20 mg total) by mouth every morning. May fill 30 days after date on prescription.    Dispense:  30 capsule    Refill:  0    Order Specific Question:  Supervising Provider    Answer:  DOOLITTLE, ROBERT P [[id number]]  . amphetamine-dextroamphetamine (ADDERALL XR) 20 MG 24 hr capsule    Sig: Take 1 capsule (20 mg total) by mouth every morning. May fill 60 days after date on prescription.    Dispense:  30 capsule    Refill:  0    Order Specific Question:  Supervising Provider    Answer:  DOOLITTLE, ROBERT P [[id number]]

## 2015-01-17 ENCOUNTER — Ambulatory Visit: Payer: Self-pay | Admitting: Neurology

## 2015-01-19 ENCOUNTER — Encounter: Payer: Self-pay | Admitting: *Deleted

## 2015-01-24 ENCOUNTER — Ambulatory Visit (INDEPENDENT_AMBULATORY_CARE_PROVIDER_SITE_OTHER): Payer: BLUE CROSS/BLUE SHIELD | Admitting: Neurology

## 2015-01-24 ENCOUNTER — Encounter: Payer: Self-pay | Admitting: Neurology

## 2015-01-24 VITALS — BP 118/84 | HR 84 | Ht 68.5 in | Wt 197.0 lb

## 2015-01-24 DIAGNOSIS — R519 Headache, unspecified: Secondary | ICD-10-CM

## 2015-01-24 DIAGNOSIS — G44229 Chronic tension-type headache, not intractable: Secondary | ICD-10-CM

## 2015-01-24 DIAGNOSIS — G501 Atypical facial pain: Secondary | ICD-10-CM

## 2015-01-24 DIAGNOSIS — R51 Headache: Secondary | ICD-10-CM

## 2015-01-24 MED ORDER — NORTRIPTYLINE HCL 25 MG PO CAPS: 25.0000 mg | ORAL_CAPSULE | Freq: Every day | ORAL | Status: DC

## 2015-01-24 NOTE — Progress Notes (Signed)
NEUROLOGY CONSULTATION NOTE  [name] MRN: [id number] DOB: 06-06-1956  Referring provider: Araceli Bouche, PA-C Primary care provider: Lou Miner, MD  Reason for consult:  Headache and facial burning  HISTORY OF PRESENT ILLNESS: [name] is a 59 year old right-handed man with ADHD, hypertension, 1st degree AV block who presents for facial burning and headache.  Records and labs reviewed.  He started having a headache around November.  He had sinus tenderness, mucosal edema and rhinorrhea.  He also reported left sided headache, which was initially severe.  CT of the sinuses were reportedly negative.  He was nonetheless treated for sinusitis with amoxicillin.  Headaches had resolved for a few weeks but then returned in January, but less intense.  The headaches are located in the left temple.  He describes it as a constant non-throbbing pain with an intensity of 3/10.  It is not associated with nausea, photophobia, phonophobia, visual disturbance or autonomic symptoms.  It typically occurs every other day, in the evening.  He denies neck pain.  He may take an ibuprofen or Excedrin every once in a while.  He began experiencing a burning sensation over the the left forehead to left side of the top of head and temple.  He did not develop a rash.  He was started on valacyclovir for possible shingles.  The burning pain subsided.  He denies history of headache.  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  Sed Rate from December was 16.  PAST MEDICAL HISTORY: Past Medical History  Diagnosis Date  . ADHD (attention deficit hyperactivity disorder)   . Smoker     quit about acouple yrs ago  . GERD (gastroesophageal reflux disease)     occasionally  . Insomnia     PAST SURGICAL HISTORY: Past Surgical History  Procedure Laterality Date  . Throat surgery      Across from [facility]  . Wisdom tooth extraction      . Total shoulder arthroplasty Right 10/08/2013    Procedure: RIGHT TOTAL SHOULDER ARTHROPLASTY;  Surgeon: Augustin Schooling, MD;  Location: [facility];  Service: Orthopedics;  Laterality: Right;    MEDICATIONS: Current Outpatient Prescriptions on File Prior to Visit  Medication Sig Dispense Refill  . amphetamine-dextroamphetamine (ADDERALL XR) 20 MG 24 hr capsule Take 1 capsule (20 mg total) by mouth every morning. 30 capsule 0  . aspirin 81 MG tablet Take 81 mg by mouth every other day.    . tadalafil (CIALIS) 20 MG tablet Take 1 tablet (20 mg total) by mouth daily as needed for erectile dysfunction. 10 tablet 11  . zolpidem (AMBIEN) 10 MG tablet Take 1 tablet (10 mg total) by mouth at bedtime as needed for sleep. 90 tablet 0   No current facility-administered medications on file prior to visit.    ALLERGIES: No Known Allergies  FAMILY HISTORY: Family History  Problem Relation Age of Onset  . Colon cancer Neg Hx   . Esophageal cancer Neg Hx   . Rectal cancer Neg Hx   . Stomach cancer Neg Hx   . Gout Father   . Alzheimer's disease Father   . Hypertension Brother   . Hypertension Brother   . Heart Problems Mother     SOCIAL HISTORY: History   Social History  . Marital Status: Married    Spouse Name: N/A  . Number of Children: N/A  . Years  of Education: N/A   Occupational History  . Building services engineer    Social History Main Topics  . Smoking status: Former Smoker -- 0.10 packs/day for 30 years    Types: Cigarettes    Quit date: 12/02/2010  . Smokeless tobacco: Never Used     Comment: Pt states smoking 1 and 1/2 cigs per day 04/05/13//lmr  . Alcohol Use: 9.0 oz/week    15 Cans of beer per week     Comment: 4 days a wk 6 beers  . Drug Use: No  . Sexual Activity: Yes     Comment: number of sex partners in the last months  1   Other Topics Concern  . Not on file   Social History Narrative   Exercise biking 2 times a week for years    REVIEW OF SYSTEMS: Constitutional:  No fevers, chills, or sweats, no generalized fatigue, change in appetite Eyes: No visual changes, double vision, eye pain Ear, nose and throat: No hearing loss, ear pain, nasal congestion, sore throat Cardiovascular: No chest pain, palpitations Respiratory:  No shortness of breath at rest or with exertion, wheezes GastrointestinaI: No nausea, vomiting, diarrhea, abdominal pain, fecal incontinence Genitourinary:  No dysuria, urinary retention or frequency Musculoskeletal:  No neck pain, back pain Integumentary: No rash, pruritus, skin lesions Neurological: as above Psychiatric: No depression, insomnia, anxiety Endocrine: No palpitations, fatigue, diaphoresis, mood swings, change in appetite, change in weight, increased thirst Hematologic/Lymphatic:  No anemia, purpura, petechiae. Allergic/Immunologic: no itchy/runny eyes, nasal congestion, recent allergic reactions, rashes  PHYSICAL EXAM: Filed Vitals:   01/24/15 0917  BP: 118/84  Pulse: 84   General: No acute distress Head:  Normocephalic/atraumatic Eyes:  fundi unremarkable, without vessel changes, exudates, hemorrhages or papilledema. Neck: supple, no paraspinal tenderness, full range of motion Back: No paraspinal tenderness Heart: regular rate and rhythm Lungs: Clear to auscultation bilaterally. Vascular: No carotid bruits. Neurological Exam: Mental status: alert and oriented to person, place, and time, recent and remote memory intact, fund of knowledge intact, attention and concentration intact, speech fluent and not dysarthric, language intact. Cranial nerves: CN I: not tested CN II: pupils equal, round and reactive to light, visual fields intact, fundi unremarkable, without vessel changes, exudates, hemorrhages or papilledema. CN III, IV, VI:  full range of motion, no nystagmus, no ptosis CN V: facial sensation intact CN VII: upper and lower face symmetric CN VIII: hearing intact CN IX, X: gag intact, uvula midline CN XI:  sternocleidomastoid and trapezius muscles intact CN XII: tongue midline Bulk & Tone: normal, no fasciculations. Motor:  5/5 throughout Sensation:  Temperature and vibration intact Deep Tendon Reflexes:  2+ throughout, toes downgoing. Finger to nose testing:  No dysmetria Heel to shin:  No dysmetria Gait:  Normal station and stride.  Able to turn and walk in tandem. Romberg negative.  IMPRESSION: Left sided headache.  It sounds like a tension type headache at this point Left sided upper facial burning.  Possible neuralgia related to shingles without rash.  Resolved  PLAN: 1.  We will try amitriptyline 25mg  at bedtime.  He is to call in 4 weeks with update. 2.  We will get MRI of brain with and without contrast for this new unilateral headache and facial pain 3.  Follow up in 3 months.  Thank you for allowing me to take part in the care of this patient.  Metta Clines, DO  CC:  Araceli Bouche, PA-C  Lou Miner, MD

## 2015-01-24 NOTE — Patient Instructions (Signed)
1.  Start nortriptyline 25mg  at bedtime.  It is an antidepressant often used for chronic headaches.  It can also help you sleep.  Call in 4 weeks with update and we can adjust dose if needed. 2.  MRI of brain with and without contrast 3.  Follow up in 3 months.

## 2015-01-27 ENCOUNTER — Telehealth: Payer: Self-pay | Admitting: Neurology

## 2015-01-27 NOTE — Telephone Encounter (Signed)
Note faxed to Dr Elder Cyphers at [phone] with confirmation received.

## 2015-02-10 ENCOUNTER — Ambulatory Visit (HOSPITAL_COMMUNITY): Payer: BLUE CROSS/BLUE SHIELD

## 2015-02-13 ENCOUNTER — Telehealth: Payer: Self-pay | Admitting: Neurology

## 2015-02-13 NOTE — Telephone Encounter (Signed)
Pt called to give a update he states that the headaches are almost gone. He stop taking the pills sue the fact that he was sleeping tohard and it was hard for hm to wake up and focus. Pt phone number is [phone]

## 2015-02-22 ENCOUNTER — Ambulatory Visit (HOSPITAL_COMMUNITY)
Admission: RE | Admit: 2015-02-22 | Discharge: 2015-02-22 | Disposition: A | Discharge status: Home / Self Care | Payer: BLUE CROSS/BLUE SHIELD | Source: Ambulatory Visit | Attending: Neurology | Admitting: Neurology

## 2015-02-22 DIAGNOSIS — R930 Abnormal findings on diagnostic imaging of skull and head, not elsewhere classified: Secondary | ICD-10-CM | POA: Insufficient documentation

## 2015-02-22 DIAGNOSIS — R51 Headache: Secondary | ICD-10-CM | POA: Diagnosis not present

## 2015-02-22 DIAGNOSIS — R519 Headache, unspecified: Secondary | ICD-10-CM

## 2015-02-22 DIAGNOSIS — G44229 Chronic tension-type headache, not intractable: Secondary | ICD-10-CM

## 2015-02-22 DIAGNOSIS — G501 Atypical facial pain: Secondary | ICD-10-CM

## 2015-02-22 LAB — CREATININE, SERUM
Creatinine, Ser: 1.06 mg/dL (ref 0.50–1.35)
GFR calc Af Amer: 86 mL/min — ABNORMAL LOW (ref >90)
GFR, EST NON AFRICAN AMERICAN: 74 mL/min — AB (ref >90)

## 2015-02-22 MED ORDER — GADOBENATE DIMEGLUMINE 529 MG/ML IV SOLN
20.0000 mL | Freq: Once | INTRAVENOUS | Status: AC
  Administered 2015-02-22: 18 mL via INTRAVENOUS

## 2015-02-23 ENCOUNTER — Telehealth: Payer: Self-pay | Admitting: *Deleted

## 2015-02-23 NOTE — Telephone Encounter (Signed)
Left message for patient regarding MRI of brain results

## 2015-02-23 NOTE — Telephone Encounter (Signed)
-----   Message from Pieter Partridge, DO sent at 02/22/2015 12:30 PM EDT ----- MRI of brain looks unremarkable.  There are some mild changes related to history of high blood pressure and smoking, but nothing that looks worrisome ----- Message -----    From: Rad Results In Interface    Sent: 02/22/2015  10:52 AM      To: Pieter Partridge, DO

## 2015-03-16 ENCOUNTER — Telehealth: Payer: Self-pay

## 2015-03-16 DIAGNOSIS — F908 Attention-deficit hyperactivity disorder, other type: Secondary | ICD-10-CM

## 2015-03-16 DIAGNOSIS — Z7189 Other specified counseling: Secondary | ICD-10-CM

## 2015-03-16 DIAGNOSIS — N528 Other male erectile dysfunction: Secondary | ICD-10-CM

## 2015-03-16 DIAGNOSIS — G4700 Insomnia, unspecified: Secondary | ICD-10-CM

## 2015-03-16 DIAGNOSIS — F909 Attention-deficit hyperactivity disorder, unspecified type: Secondary | ICD-10-CM

## 2015-03-16 DIAGNOSIS — D649 Anemia, unspecified: Secondary | ICD-10-CM

## 2015-03-16 NOTE — Telephone Encounter (Signed)
Pt requesting a refill on zolpidem (AMBIEN) 10 MG tablet [[id number]]

## 2015-03-20 MED ORDER — ZOLPIDEM TARTRATE 10 MG PO TABS: 10.0000 mg | ORAL_TABLET | Freq: Every evening | ORAL | Status: DC | PRN

## 2015-03-20 NOTE — Telephone Encounter (Signed)
Can someone help>? 

## 2015-03-20 NOTE — Telephone Encounter (Signed)
Dr. Elder Cyphers has gone for the day, but generally, this request  should go to his in basket, rather than the PA/Provider Pool.  I can authorize a 30-day supply, but not a 90-day supply. Or, if he prefers, can can wait for Dr. Elder Cyphers to authorize the 90-day supply.

## 2015-03-20 NOTE — Telephone Encounter (Signed)
Pt called again this morning regarding his AMBIEN 10 MG. Please call [phone]

## 2015-03-21 NOTE — Telephone Encounter (Signed)
Dr. Elder Cyphers approved this already on 03/20/2015. Rx called in. Pt notified.

## 2015-04-19 ENCOUNTER — Telehealth: Payer: Self-pay

## 2015-04-19 DIAGNOSIS — F908 Attention-deficit hyperactivity disorder, other type: Secondary | ICD-10-CM

## 2015-04-19 DIAGNOSIS — Z7189 Other specified counseling: Secondary | ICD-10-CM

## 2015-04-19 NOTE — Telephone Encounter (Signed)
Patient is calling to request a refill for Adderral. Please call when ready for pick up! [phone]

## 2015-04-21 ENCOUNTER — Encounter: Payer: Self-pay | Admitting: Gastroenterology

## 2015-04-21 NOTE — Telephone Encounter (Signed)
PATIENT CALLED TO CHECK STATUS OF REQUEST.    [phone]

## 2015-04-21 NOTE — Telephone Encounter (Signed)
Patient called to check status of request. Please call when ready at [phone]

## 2015-04-24 MED ORDER — AMPHETAMINE-DEXTROAMPHET ER 20 MG PO CP24: 20.0000 mg | ORAL_CAPSULE | ORAL | Status: DC

## 2015-04-24 NOTE — Telephone Encounter (Signed)
Refill 3 months

## 2015-04-24 NOTE — Telephone Encounter (Signed)
Done, given to pt

## 2015-04-26 ENCOUNTER — Ambulatory Visit (INDEPENDENT_AMBULATORY_CARE_PROVIDER_SITE_OTHER): Payer: BLUE CROSS/BLUE SHIELD | Admitting: Neurology

## 2015-04-26 ENCOUNTER — Encounter: Payer: Self-pay | Admitting: Neurology

## 2015-04-26 VITALS — BP 140/80 | HR 80 | Resp 22 | Ht 68.0 in | Wt 195.5 lb

## 2015-04-26 DIAGNOSIS — R208 Other disturbances of skin sensation: Secondary | ICD-10-CM

## 2015-04-26 DIAGNOSIS — G44219 Episodic tension-type headache, not intractable: Secondary | ICD-10-CM | POA: Diagnosis not present

## 2015-04-26 NOTE — Progress Notes (Signed)
NEUROLOGY FOLLOW UP OFFICE NOTE  [name] [id number]  HISTORY OF PRESENT ILLNESS: [name] is a 59 year old right-handed man with ADHD, hypertension, 1st degree AV block who follows up for left-sided headache and upper facial burning.  MRI of brain reviewed.  UPDATE: MRI of the brain with and without contrast performed on 02/22/15 showed chronic small vessel disease involving the periventricular white matter and brainstem, but no acute abnormality to explain symptoms.  He is doing well.  He no longer has headaches or facial burning.  He stopped the nortriptyline and is doing fine.  HISTORY: He started having a headache around November.  He had sinus tenderness, mucosal edema and rhinorrhea.  He also reported left sided headache, which was initially severe.  CT of the sinuses were reportedly negative.  He was nonetheless treated for sinusitis with amoxicillin.  Headaches had resolved for a few weeks but then returned in January, but less intense.  The headaches are located in the left temple.  He describes it as a constant non-throbbing pain with an intensity of 3/10.  It is not associated with nausea, photophobia, phonophobia, visual disturbance or autonomic symptoms.  It typically occurs every other day, in the evening.  He denies neck pain.  He may take an ibuprofen or Excedrin every once in a while.  He began experiencing a burning sensation over the the left forehead to left side of the top of head and temple.  He did not develop a rash.  He was started on valacyclovir for possible shingles.  The burning pain subsided.  He denies history of headache.  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  Sed Rate from December was 16.  PAST MEDICAL HISTORY: Past Medical History  Diagnosis Date  . ADHD (attention deficit hyperactivity disorder)   . Smoker     quit about acouple yrs ago  . GERD (gastroesophageal  reflux disease)     occasionally  . Insomnia     MEDICATIONS: Current Outpatient Prescriptions on File Prior to Visit  Medication Sig Dispense Refill  . amphetamine-dextroamphetamine (ADDERALL XR) 20 MG 24 hr capsule Take 1 capsule (20 mg total) by mouth every morning. 30 capsule 0  . amphetamine-dextroamphetamine (ADDERALL XR) 20 MG 24 hr capsule Take 1 capsule (20 mg total) by mouth every morning. Ok to fill 30 days from date written 30 capsule 0  . amphetamine-dextroamphetamine (ADDERALL XR) 20 MG 24 hr capsule Take 1 capsule (20 mg total) by mouth every morning. Ok to fill 60 days from date written 30 capsule 0  . aspirin 81 MG tablet Take 81 mg by mouth every other day.    . nortriptyline (PAMELOR) 25 MG capsule Take 1 capsule (25 mg total) by mouth at bedtime. 30 capsule 3  . tadalafil (CIALIS) 20 MG tablet Take 1 tablet (20 mg total) by mouth daily as needed for erectile dysfunction. 10 tablet 11  . zolpidem (AMBIEN) 10 MG tablet Take 1 tablet (10 mg total) by mouth at bedtime as needed for sleep. 90 tablet 0   No current facility-administered medications on file prior to visit.    ALLERGIES: No Known Allergies  FAMILY HISTORY: Family History  Problem Relation Age of Onset  . Colon cancer Neg Hx   . Esophageal cancer Neg Hx   . Rectal cancer Neg Hx   . Stomach cancer Neg Hx   . Gout  Father   . Alzheimer's disease Father   . Hypertension Brother   . Hypertension Brother   . Heart Problems Mother     SOCIAL HISTORY: History   Social History  . Marital Status: Married    Spouse Name: N/A  . Number of Children: N/A  . Years of Education: N/A   Occupational History  . Building services engineer    Social History Main Topics  . Smoking status: Former Smoker -- 0.10 packs/day for 30 years    Types: Cigarettes    Quit date: 12/02/2010  . Smokeless tobacco: Never Used     Comment: Pt states smoking 1 and 1/2 cigs per day 04/05/13//lmr  . Alcohol Use: 9.0 oz/week    15 Cans of  beer per week     Comment: 4 days a wk 6 beers  . Drug Use: No  . Sexual Activity:    Partners: Female     Comment: number of sex partners in the last months  1   Other Topics Concern  . Not on file   Social History Narrative   Exercise biking 2 times a week for years    REVIEW OF SYSTEMS: Constitutional: No fevers, chills, or sweats, no generalized fatigue, change in appetite Eyes: No visual changes, double vision, eye pain Ear, nose and throat: No hearing loss, ear pain, nasal congestion, sore throat Cardiovascular: No chest pain, palpitations Respiratory:  No shortness of breath at rest or with exertion, wheezes GastrointestinaI: No nausea, vomiting, diarrhea, abdominal pain, fecal incontinence Genitourinary:  No dysuria, urinary retention or frequency Musculoskeletal:  No neck pain, back pain Integumentary: No rash, pruritus, skin lesions Neurological: as above Psychiatric: No depression, insomnia, anxiety Endocrine: No palpitations, fatigue, diaphoresis, mood swings, change in appetite, change in weight, increased thirst Hematologic/Lymphatic:  No anemia, purpura, petechiae. Allergic/Immunologic: no itchy/runny eyes, nasal congestion, recent allergic reactions, rashes  PHYSICAL EXAM: Filed Vitals:   04/26/15 0900  BP: 140/80  Pulse: 80  Resp: 22   General: No acute distress Head:  Normocephalic/atraumatic Eyes:  Fundoscopic exam unremarkable without vessel changes, exudates, hemorrhages or papilledema. Neck: supple, no paraspinal tenderness, full range of motion Heart:  Regular rate and rhythm Lungs:  Clear to auscultation bilaterally Back: No paraspinal tenderness Neurological Exam: alert and oriented to person, place, and time. Attention span and concentration intact, recent and remote memory intact, fund of knowledge intact.  Speech fluent and not dysarthric, language intact.  CN II-XII intact. Fundoscopic exam unremarkable without vessel changes, exudates,  hemorrhages or papilledema.  Bulk and tone normal, muscle strength 5/5 throughout.  Sensation to light touch, temperature and vibration intact.  Deep tendon reflexes 2+ throughout, toes downgoing.  Finger to nose and heel to shin testing intact.  Gait normal.  IMPRESSION: Left sided headache, possibly tension-type, resolved Left sided upper facial burning.  Possible neuralgia related to shingles without rash.  Resolved  PLAN: Follow up as needed.  15 minutes spent with patient face to face, over 50% spent discussing possible etiologies and plan.  Metta Clines, DO  CC:  Lou Miner, MD

## 2015-06-14 ENCOUNTER — Other Ambulatory Visit: Payer: Self-pay | Admitting: Physician Assistant

## 2015-06-14 ENCOUNTER — Telehealth: Payer: Self-pay

## 2015-06-14 DIAGNOSIS — F909 Attention-deficit hyperactivity disorder, unspecified type: Secondary | ICD-10-CM

## 2015-06-14 DIAGNOSIS — Z7189 Other specified counseling: Secondary | ICD-10-CM

## 2015-06-14 DIAGNOSIS — D649 Anemia, unspecified: Secondary | ICD-10-CM

## 2015-06-14 DIAGNOSIS — G4700 Insomnia, unspecified: Secondary | ICD-10-CM

## 2015-06-14 DIAGNOSIS — N528 Other male erectile dysfunction: Secondary | ICD-10-CM

## 2015-06-14 DIAGNOSIS — F908 Attention-deficit hyperactivity disorder, other type: Secondary | ICD-10-CM

## 2015-06-14 MED ORDER — ZOLPIDEM TARTRATE 10 MG PO TABS: 10.0000 mg | ORAL_TABLET | Freq: Every evening | ORAL | Status: DC | PRN

## 2015-06-14 NOTE — Telephone Encounter (Signed)
PATIENT WOULD LIKE DR. Elder Cyphers TO KNOW THAT HE ONLY HAS 1 WEEK WORTH OF HIS AMBIEN 10 MG LEFT. HE WOULD  LIKE TO GO AHEAD AND CALL HIS REFILL IN EARLY. BEST PHONE [phone] (CELL) PHARMACY CHOICE IS [facility] ON [address] - OR CALL HIM WHEN IT CAN BE PICKED UP. [place]

## 2015-06-14 NOTE — Telephone Encounter (Signed)
Can someone help Dr. Elder Cyphers pt?

## 2015-06-15 NOTE — Progress Notes (Signed)
Called Rx in and left voicemail letting pt know to RTC before Rx runs out.

## 2015-06-19 ENCOUNTER — Encounter: Payer: Self-pay | Admitting: Family Medicine

## 2015-07-17 ENCOUNTER — Telehealth: Payer: Self-pay

## 2015-07-17 NOTE — Telephone Encounter (Signed)
Spoke with pt, advised message. 

## 2015-07-17 NOTE — Telephone Encounter (Signed)
Patient needs to come to clinic before then. He has not been here for f/u and these medications have counteracting effects. They also pose increased risks for his age group. So no refills for this patient for controlled substances unless he comes back for f/u.

## 2015-07-17 NOTE — Telephone Encounter (Signed)
Pt is needing a refill on ambien and adderall in about a week   Best number [phone]

## 2015-07-25 ENCOUNTER — Ambulatory Visit (INDEPENDENT_AMBULATORY_CARE_PROVIDER_SITE_OTHER): Payer: BLUE CROSS/BLUE SHIELD | Admitting: Internal Medicine

## 2015-07-25 VITALS — BP 124/84 | HR 73 | Temp 97.7°F | Resp 18 | Ht 68.75 in | Wt 190.6 lb

## 2015-07-25 DIAGNOSIS — N528 Other male erectile dysfunction: Secondary | ICD-10-CM | POA: Diagnosis not present

## 2015-07-25 DIAGNOSIS — D649 Anemia, unspecified: Secondary | ICD-10-CM

## 2015-07-25 DIAGNOSIS — Z7189 Other specified counseling: Secondary | ICD-10-CM

## 2015-07-25 DIAGNOSIS — F908 Attention-deficit hyperactivity disorder, other type: Secondary | ICD-10-CM

## 2015-07-25 DIAGNOSIS — G4700 Insomnia, unspecified: Secondary | ICD-10-CM

## 2015-07-25 DIAGNOSIS — Z719 Counseling, unspecified: Secondary | ICD-10-CM

## 2015-07-25 DIAGNOSIS — F909 Attention-deficit hyperactivity disorder, unspecified type: Secondary | ICD-10-CM

## 2015-07-25 MED ORDER — AMPHETAMINE-DEXTROAMPHET ER 20 MG PO CP24: 20.0000 mg | ORAL_CAPSULE | ORAL | Status: DC

## 2015-07-25 MED ORDER — ZOLPIDEM TARTRATE 10 MG PO TABS: 10.0000 mg | ORAL_TABLET | Freq: Every evening | ORAL | Status: DC | PRN

## 2015-07-25 NOTE — Progress Notes (Signed)
Patient ID: [name], male   DOB: Apr 16, 1956, 59 y.o.   MRN: [id number]   07/25/2015 at 8:40 AM  [name] / DOB: 1956-04-21 / MRN: [id number]  Problem list reviewed and updated by me where necessary.   SUBJECTIVE  [name] is a 59 y.o. well appearing male. He needs refill of insomnia med. Is doing well and only uses prn when work keeps him awake.     He  has a past medical history of ADHD (attention deficit hyperactivity disorder); Smoker; GERD (gastroesophageal reflux disease); and Insomnia.    Medications reviewed and updated by myself where necessary, and exist elsewhere in the encounter.   [name] has No Known Allergies. He  reports that he quit smoking about 4 years ago. His smoking use included Cigarettes. He has a 3 pack-year smoking history. He has never used smokeless tobacco. He reports that he drinks about 9.0 oz of alcohol per week. He reports that he does not use illicit drugs. He  reports that he currently engages in sexual activity and has had male partners. The patient  has past surgical history that includes Throat surgery; Wisdom tooth extraction; and Total shoulder arthroplasty (Right, 10/08/2013).  His family history includes Alzheimer's disease in his father; Gout in his father; Heart Problems in his mother; Hypertension in his brother and brother. There is no history of Colon cancer, Esophageal cancer, Rectal cancer, or Stomach cancer.  Review of Systems  Constitutional: Negative for fever.  Respiratory: Negative for shortness of breath.   Cardiovascular: Negative for chest pain.  Gastrointestinal: Negative for nausea.  Skin: Negative for rash.  Neurological: Negative for dizziness and headaches.    OBJECTIVE  His  height is 5' 8.75" (1.746 m) and weight is 190 lb 9.6 oz (86.456 kg). His oral temperature is 97.7 F (36.5 C). His blood pressure is 124/84 and his pulse is 73. His respiration is 18 and oxygen saturation is 98%.  The  patient's body mass index is 28.36 kg/(m^2).  Physical Exam  Constitutional: He is oriented to person, place, and time. He appears well-developed and well-nourished. No distress.  HENT:  Head: Normocephalic.  Nose: Nose normal.  Eyes: Conjunctivae and EOM are normal.  Respiratory: Effort normal.  Neurological: He is alert and oriented to person, place, and time. He exhibits normal muscle tone. Coordination normal.  Psychiatric: He has a normal mood and affect.    No results found for this or any previous visit (from the past 24 hour(s)).  ASSESSMENT & PLAN  [name] was seen today for medication refill.  Diagnoses and all orders for this visit:  Anemia, unspecified anemia type -     Discontinue: zolpidem (AMBIEN) 10 MG tablet; Take 1 tablet (10 mg total) by mouth at bedtime as needed for sleep. Will need appt for add'l refills. -     zolpidem (AMBIEN) 10 MG tablet; Take 1 tablet (10 mg total) by mouth at bedtime as needed for sleep. Will need appt for add'l refills.  Insomnia -     Discontinue: zolpidem (AMBIEN) 10 MG tablet; Take 1 tablet (10 mg total) by mouth at bedtime as needed for sleep. Will need appt for add'l refills. -     zolpidem (AMBIEN) 10 MG tablet; Take 1 tablet (10 mg total) by mouth at bedtime as needed for sleep. Will need appt for add'l refills.  Attention deficit hyperactivity disorder (ADHD), unspecified ADHD type -     Discontinue: zolpidem (AMBIEN) 10 MG tablet;  Take 1 tablet (10 mg total) by mouth at bedtime as needed for sleep. Will need appt for add'l refills. -     zolpidem (AMBIEN) 10 MG tablet; Take 1 tablet (10 mg total) by mouth at bedtime as needed for sleep. Will need appt for add'l refills.  Attention-deficit hyperactivity disorder, other type -     Discontinue: zolpidem (AMBIEN) 10 MG tablet; Take 1 tablet (10 mg total) by mouth at bedtime as needed for sleep. Will need appt for add'l refills. -     zolpidem (AMBIEN) 10 MG tablet; Take 1 tablet (10  mg total) by mouth at bedtime as needed for sleep. Will need appt for add'l refills.  Encounter for medication review and counseling -     Discontinue: zolpidem (AMBIEN) 10 MG tablet; Take 1 tablet (10 mg total) by mouth at bedtime as needed for sleep. Will need appt for add'l refills. -     zolpidem (AMBIEN) 10 MG tablet; Take 1 tablet (10 mg total) by mouth at bedtime as needed for sleep. Will need appt for add'l refills.  Other male erectile dysfunction -     Discontinue: zolpidem (AMBIEN) 10 MG tablet; Take 1 tablet (10 mg total) by mouth at bedtime as needed for sleep. Will need appt for add'l refills. -     zolpidem (AMBIEN) 10 MG tablet; Take 1 tablet (10 mg total) by mouth at bedtime as needed for sleep. Will need appt for add'l refills.  Health education/counseling

## 2015-07-25 NOTE — Patient Instructions (Signed)
Sleep Hygiene   Sleep hygiene education - Sleep hygiene teaches good sleeping habits . This includes:  ?Sleep only as much as necessary to feel rested and then get out of bed. ?Maintain a regular sleep schedule (the same bedtime and wake time every day). ?Do not force sleep. ?Avoid caffeinated beverages after lunch. ?Avoid alcohol near bedtime. ?Do not smoke (particularly during the evening). ?Do not go to bed hungry. ?Adjust the bedroom environment (light, noise, temperature) so that you are comfortable before you lie down. ?Deal with concerns or worries before bedtime. Make a list of things to work on for the next day so anxiety is reduced at night. ?Exercise regularly, preferably four or more hours before bedtime. ?Avoid prolonged use of phones or reading devices ("e-books") that give off light before bed. This can make it harder to fall asleep.  Relaxation - Relaxation therapy involves progressively relaxing your muscles from your head down to your feet. Here is a sample of a relaxation program: Beginning with the muscles in your face, squeeze (contract) your muscles gently for one to two seconds and then relax. Repeat several times. Use the same technique for other muscle groups, usually in the following sequence: jaw and neck, shoulders, upper arms, lower arms, fingers, chest, abdomen, buttocks, thighs, calves, and feet. Repeat this cycle for 45 minutes, if necessary. This relaxation program can promote restfulness and sleep. Relaxation therapy is sometimes combined with biofeedback.  Biofeedback - Biofeedback uses sensors placed on your skin to track muscle tension or brain rhythms. You can see a display of your tension level or activity, allowing you to gauge your level of tension and develop strategies to reduce this tension. As an example, you may slow your breathing, progressively relax muscles, or practice deep breathing to reduce tension.  Stimulus control - Stimulus control therapy  is based on the idea that some people with insomnia have learned to associate the bedroom with staying awake rather than sleeping. ?You should spend no more than 20 minutes lying in bed trying to fall asleep. ?If you cannot fall asleep within 20 minutes, get up, go to another room and read or find another relaxing activity until you feel sleepy again. Activities such as eating, balancing your checkbook, doing housework, watching TV, or studying for a test, which "reward" you for staying awake, should be avoided. ?When you start to feel sleepy, you can return to bed. If you cannot fall asleep in another 20 minutes, repeat the process. ?Set an alarm clock and get up at the same time every day, including weekends. ?Do not take a nap during the day. You may not sleep much on the first night. However, sleep is more likely on succeeding nights because sleepiness is increased and naps are not allowed. Sleep restriction - Some people with insomnia have long awakenings during the night and some try to deal with their poor sleep by staying in bed longer in the morning to "make up" some of their lost sleep. This additional sleep later in the morning may make it more difficult to fall asleep that night, resulting in the need to stay in bed even longer the following morning. Sleep restriction consolidates sleep and breaks this cycle. ?The first step in sleep restriction therapy is to estimate the average number of hours per night that you sleep. Decrease the total time allowed in bed per night to that average sleep time, as long as it is not less than four hours. ?A rigid bedtime and awakening time   are recommended and naps are not permitted. This causes partial sleep deprivation, which increases your ability to sleep the next night. ?Once sleep has improved, you may slowly increase your time in bed to find your needed hours of sleep. During the first few days to weeks, you may feel sleepy during the day and may have  difficulty being alert. You can deal with this by increasing activity levels when sleepy, avoiding sedentary activities, and discussing the sleep restriction therapy with your therapist, who may need to fine tune sleep times. It is best to try sleep restriction therapy with a therapist because reducing sleep too much can produce sleepiness that can result in accidents.

## 2015-09-09 ENCOUNTER — Other Ambulatory Visit: Payer: Self-pay | Admitting: Internal Medicine

## 2015-09-11 NOTE — Telephone Encounter (Signed)
Ok to refill 6 months 

## 2015-09-11 NOTE — Telephone Encounter (Signed)
Patient was seen 07/25/15 can we give him a refill on Cialis how many?

## 2015-10-23 IMAGING — CR DG SINUSES COMPLETE 3+V
4 series · 4 of 4 positions shown · non-contrast
Comparison: None.

CLINICAL DATA: Sinus pressure

EXAM:
PARANASAL SINUSES - COMPLETE 3 + VIEW

[waters]
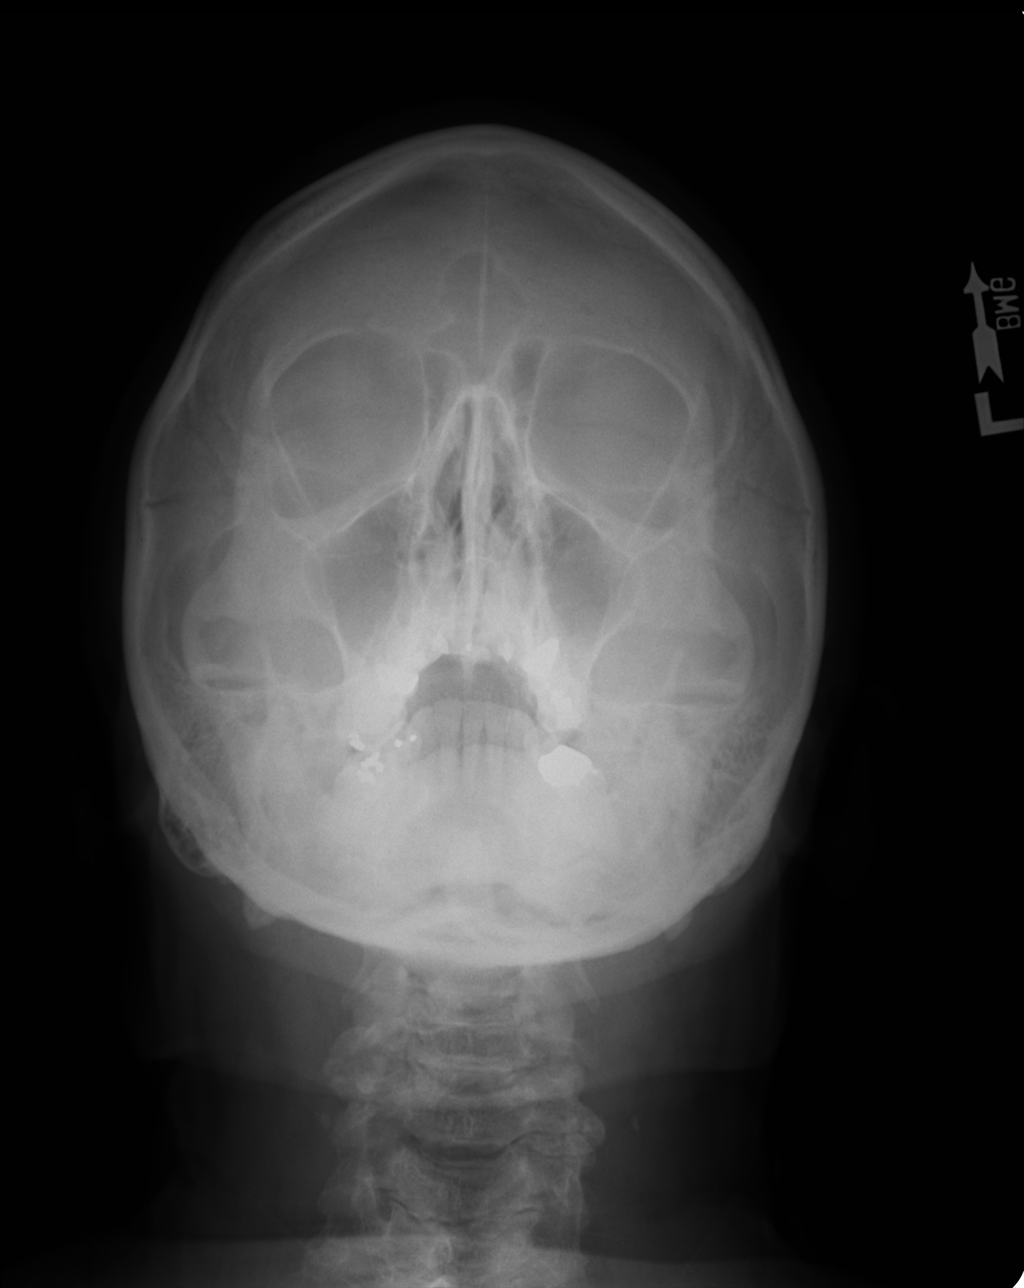

[PA]
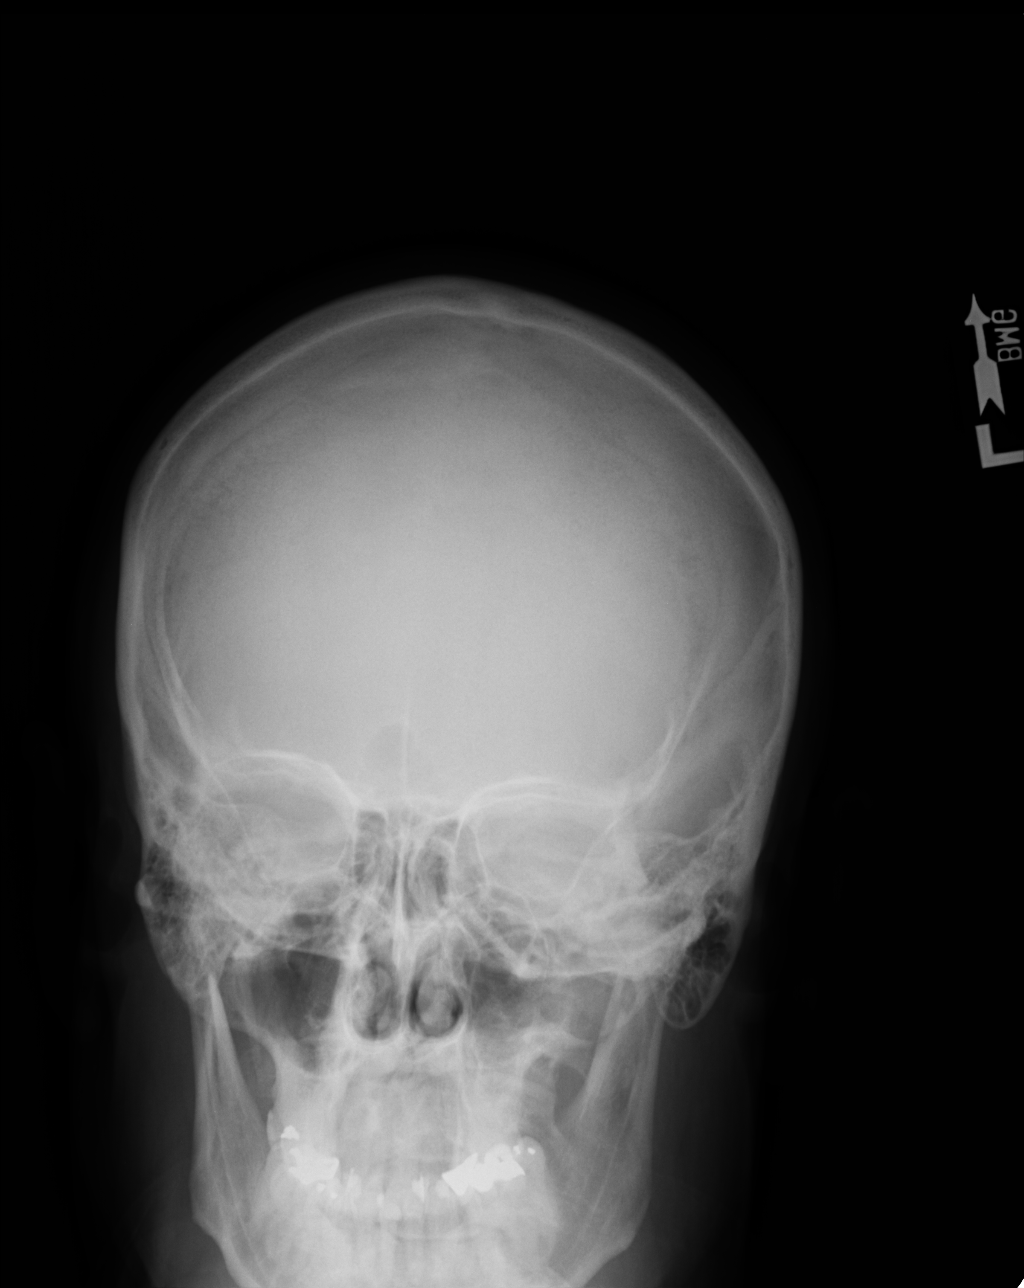

[lateral]
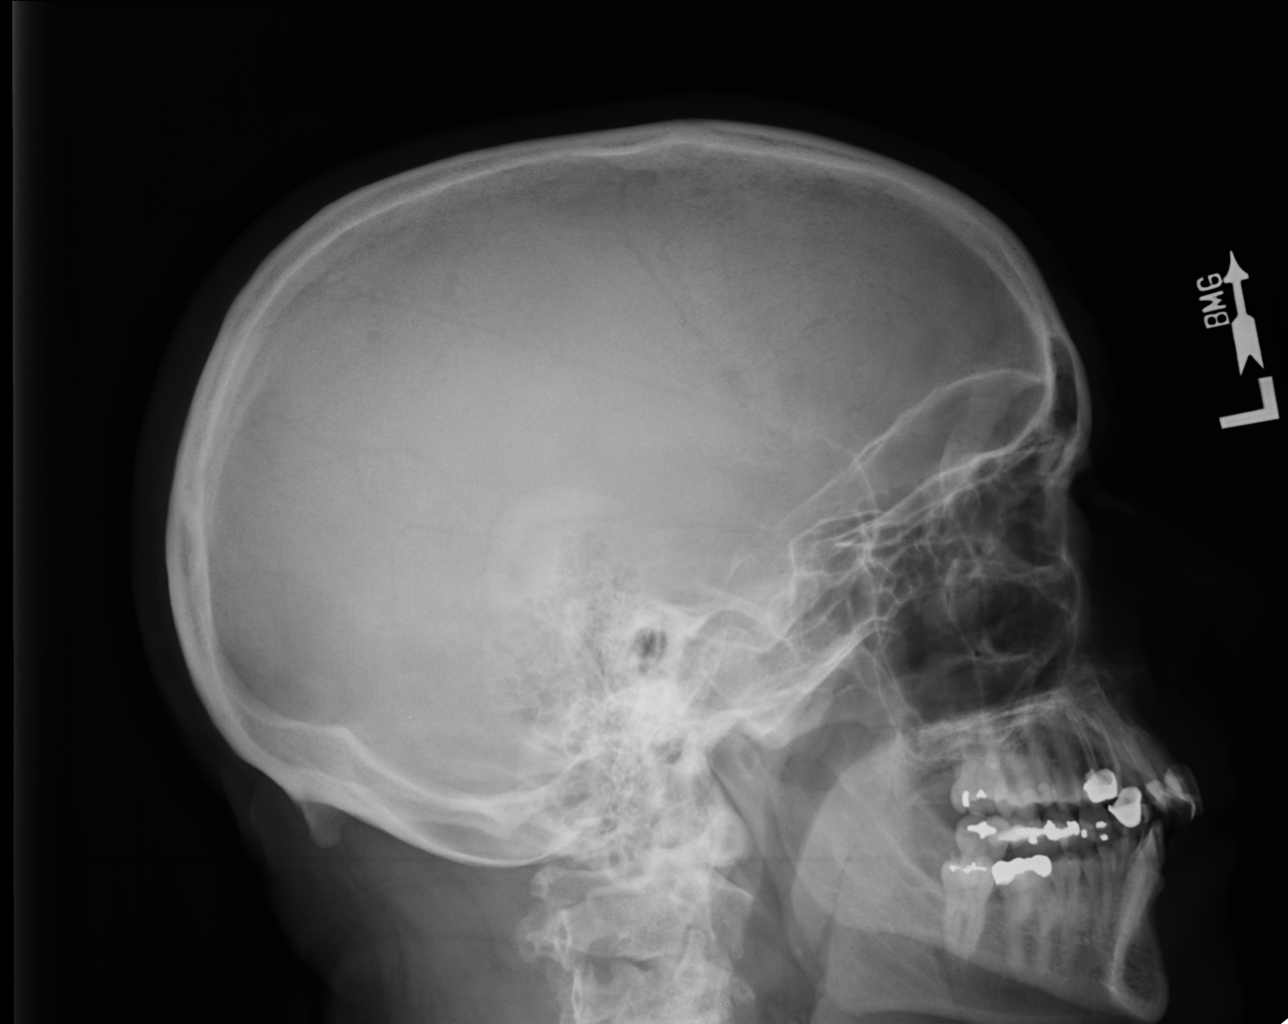

[smv]
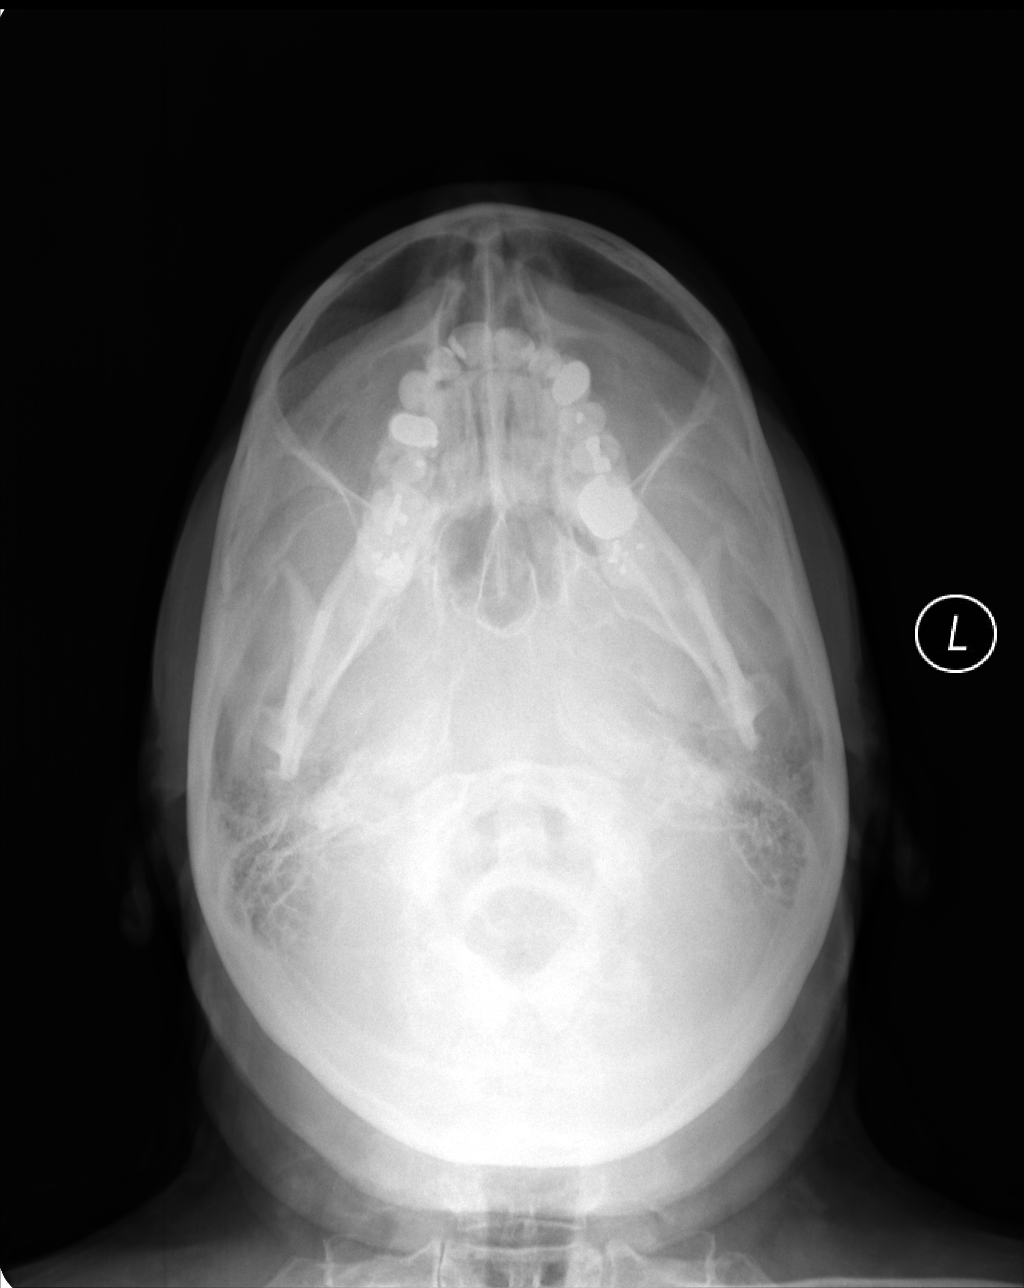

[4 of 4 positions shown; findings below may reference images not displayed]

FINDINGS: The paranasal sinuses appear well pneumatized. No air-fluid level or
mucosal thickening is seen. No bony abnormality is noted.
IMPRESSION: Negative.

## 2015-10-24 ENCOUNTER — Telehealth: Payer: Self-pay | Admitting: Internal Medicine

## 2015-10-24 ENCOUNTER — Other Ambulatory Visit: Payer: Self-pay

## 2015-10-24 DIAGNOSIS — D649 Anemia, unspecified: Secondary | ICD-10-CM

## 2015-10-24 DIAGNOSIS — G4700 Insomnia, unspecified: Secondary | ICD-10-CM

## 2015-10-24 DIAGNOSIS — F909 Attention-deficit hyperactivity disorder, unspecified type: Secondary | ICD-10-CM

## 2015-10-24 DIAGNOSIS — F908 Attention-deficit hyperactivity disorder, other type: Secondary | ICD-10-CM

## 2015-10-24 DIAGNOSIS — Z7189 Other specified counseling: Secondary | ICD-10-CM

## 2015-10-24 DIAGNOSIS — N528 Other male erectile dysfunction: Secondary | ICD-10-CM

## 2015-10-24 NOTE — Telephone Encounter (Signed)
Pt has called stating his pharmacy, [facility] on [address], has sent a refill request in for Ambien. Pt states he is needing the refill before the holidays Please call pt to advise the status of refill request at [phone]

## 2015-10-24 NOTE — Telephone Encounter (Signed)
Pt has called stating his pharmacy, [facility] on [address], has sent a refill request in for Ambien. Pt states he is needing the refill before the holidays Please call pt to advise the status of refill request at [phone]  Can someone else review this req in Dr Luiz Ochoa absence for several days?

## 2015-10-24 NOTE — Telephone Encounter (Signed)
LMOM that we can send in only 20 tabs.  If he would like the whole prescription he has to be seen by another provider or wait until Dr. Elder Cyphers is back on 10/30/15.  Advised that we will go ahead and send in the 20 tabs for the holidays.

## 2015-10-25 MED ORDER — ZOLPIDEM TARTRATE 10 MG PO TABS: 10.0000 mg | ORAL_TABLET | Freq: Every evening | ORAL | Status: DC | PRN

## 2015-10-30 ENCOUNTER — Telehealth: Payer: Self-pay

## 2015-10-30 NOTE — Telephone Encounter (Signed)
Patient called and requested a refill for Ambien

## 2015-10-31 ENCOUNTER — Other Ambulatory Visit: Payer: Self-pay | Admitting: Internal Medicine

## 2015-10-31 ENCOUNTER — Other Ambulatory Visit: Payer: Self-pay | Admitting: Physician Assistant

## 2015-10-31 DIAGNOSIS — D649 Anemia, unspecified: Secondary | ICD-10-CM

## 2015-10-31 DIAGNOSIS — G4700 Insomnia, unspecified: Secondary | ICD-10-CM

## 2015-10-31 DIAGNOSIS — Z7189 Other specified counseling: Secondary | ICD-10-CM

## 2015-10-31 DIAGNOSIS — F908 Attention-deficit hyperactivity disorder, other type: Secondary | ICD-10-CM

## 2015-10-31 DIAGNOSIS — N528 Other male erectile dysfunction: Secondary | ICD-10-CM

## 2015-10-31 DIAGNOSIS — F909 Attention-deficit hyperactivity disorder, unspecified type: Secondary | ICD-10-CM

## 2015-10-31 MED ORDER — ZOLPIDEM TARTRATE 10 MG PO TABS: 10.0000 mg | ORAL_TABLET | Freq: Every evening | ORAL | Status: DC | PRN

## 2015-10-31 NOTE — Telephone Encounter (Signed)
Dr Elder Cyphers please review.

## 2015-10-31 NOTE — Telephone Encounter (Signed)
Pt states he need a 3 months supply of his AMBIEN, cant keep coming in for the same reason and he has always gotten the 3 months. Please call pt at [place]

## 2015-11-30 ENCOUNTER — Telehealth: Payer: Self-pay | Admitting: Internal Medicine

## 2015-11-30 DIAGNOSIS — Z7189 Other specified counseling: Secondary | ICD-10-CM

## 2015-11-30 DIAGNOSIS — F908 Attention-deficit hyperactivity disorder, other type: Secondary | ICD-10-CM

## 2015-11-30 NOTE — Telephone Encounter (Signed)
Adderrall refill.  [phone]

## 2015-12-04 MED ORDER — AMPHETAMINE-DEXTROAMPHET ER 20 MG PO CP24: 20.0000 mg | ORAL_CAPSULE | ORAL | Status: AC

## 2015-12-04 NOTE — Telephone Encounter (Signed)
Meds ordered this encounter  Medications  . amphetamine-dextroamphetamine (ADDERALL XR) 20 MG 24 hr capsule    Sig: Take 1 capsule (20 mg total) by mouth every morning.    Dispense:  30 capsule    Refill:  0    Order Specific Question:  Supervising Provider    Answer:  DOOLITTLE, ROBERT P [id number]

## 2015-12-05 NOTE — Telephone Encounter (Signed)
Notified pt ready on VM.

## 2016-02-15 ENCOUNTER — Telehealth: Payer: Self-pay

## 2016-02-15 DIAGNOSIS — Z7189 Other specified counseling: Secondary | ICD-10-CM

## 2016-02-15 DIAGNOSIS — D649 Anemia, unspecified: Secondary | ICD-10-CM

## 2016-02-15 DIAGNOSIS — F909 Attention-deficit hyperactivity disorder, unspecified type: Secondary | ICD-10-CM

## 2016-02-15 DIAGNOSIS — G4700 Insomnia, unspecified: Secondary | ICD-10-CM

## 2016-02-15 DIAGNOSIS — F908 Attention-deficit hyperactivity disorder, other type: Secondary | ICD-10-CM

## 2016-02-15 DIAGNOSIS — N528 Other male erectile dysfunction: Secondary | ICD-10-CM

## 2016-02-15 MED ORDER — ZOLPIDEM TARTRATE 10 MG PO TABS: 10.0000 mg | ORAL_TABLET | Freq: Every evening | ORAL | Status: AC | PRN

## 2016-02-15 NOTE — Telephone Encounter (Signed)
I have refilled.  Please let the patient know that Dr Elder Cyphers is out indefinitely and he will need to find another PCP - either within the practice or outside of [facility].  Please try to explain to him about the provider shortage due to retiring and it may be best to try and find a PCP someone in [place] or with Dr Coletta Memos in [facility] - if he is hesitant we are of course happy to see him but it will have to be in the walk-in clinic.

## 2016-02-15 NOTE — Telephone Encounter (Signed)
Dr Elder Cyphers pt, can someone help?

## 2016-02-15 NOTE — Telephone Encounter (Signed)
Patient request a refill of Aimbien 10 MG. Patient is down to his last pill. [facility] on [address].

## 2016-02-19 NOTE — Telephone Encounter (Signed)
Spoke with pt, advised message from Judson Roch. Pt states he plans to come bu and get his records and he is currently working on this.

## 2016-05-01 ENCOUNTER — Telehealth: Payer: Self-pay

## 2016-05-01 NOTE — Telephone Encounter (Signed)
PA completed on covermymeds for cialis. Pt has tried/failed viagra previously. Pending.

## 2016-05-06 NOTE — Telephone Encounter (Signed)
PA approved through 12/01/2038. Notified pharm. 

## 2017-10-21 ENCOUNTER — Encounter (INDEPENDENT_AMBULATORY_CARE_PROVIDER_SITE_OTHER): Payer: Self-pay

## 2017-10-21 ENCOUNTER — Encounter: Payer: Self-pay | Admitting: Neurology

## 2017-10-21 ENCOUNTER — Ambulatory Visit (INDEPENDENT_AMBULATORY_CARE_PROVIDER_SITE_OTHER): Payer: BLUE CROSS/BLUE SHIELD | Admitting: Neurology

## 2017-10-21 VITALS — BP 147/91 | HR 86 | Ht 68.75 in | Wt 197.0 lb

## 2017-10-21 DIAGNOSIS — R202 Paresthesia of skin: Secondary | ICD-10-CM | POA: Diagnosis not present

## 2017-10-21 DIAGNOSIS — G2581 Restless legs syndrome: Secondary | ICD-10-CM

## 2017-10-21 MED ORDER — GABAPENTIN 300 MG PO CAPS
600.0000 mg | ORAL_CAPSULE | Freq: Three times a day (TID) | ORAL | 11 refills | Status: DC
Start: 2017-10-21 — End: 2018-11-03

## 2017-10-21 NOTE — Progress Notes (Signed)
PATIENT: [name] DOB: 1956/11/01  Chief Complaint  Patient presents with  . Left Ulnar Neuropathy    Reports no feeling in his left elbow for the last six months.  Marland Kitchen RLS    RLS has been a problem for him for years.  Symptoms are worse at night and prevents him from sleeping without Ambien.  He has tried nortriptyline 25mg  at bedtime without relief.    Marland Kitchen PCP    Sandi Mariscal, MD     HISTORICAL  [name] is a 61 year old male, seen in refer by his primary care physician Dr. Sandi Mariscal for evaluation of left arm numbness, restless leg, initial evaluation was on October 21 2017.  Have reviewed and summarized the referring note, previous UDS showed amphetamine, Ambien, hydrocodone, cocaine, he drives a lot, tends to rest his left elbow on the windshield  Since spring of 2018, he noticed gradual onset left elbow numbness, which has been persistent, he denies significant pain, there was no numbness in left hand, no neck pain,  He also had restless leg symptoms since 2014, urge to move his leg, gradually getting worse, now 5 days out of a week, he has to move his leg, take Ambien to go to sleep, previously tried amitriptyline without helping,  Multiple A1c was mildly elevated A1c 5.7.  REVIEW OF SYSTEMS: Full 14 system review of systems performed and notable only for numbness, restless leg  ALLERGIES: No Known Allergies  HOME MEDICATIONS: Current Outpatient Medications  Medication Sig Dispense Refill  . amphetamine-dextroamphetamine (ADDERALL XR) 20 MG 24 hr capsule Take 1 capsule (20 mg total) by mouth every morning. 30 capsule 0  . aspirin 81 MG tablet Take 81 mg by mouth every other day.    . Cholecalciferol (VITAMIN D PO) Take daily by mouth.    . CIALIS 20 MG tablet TAKE ONE TABLET BY MOUTH ONCE DAILY AS NEEDED FOR ERECTILE DYSFUNCTION 10 tablet 5  . Cyanocobalamin (VITAMIN B-12 PO) Take daily by mouth.    Marland Kitchen omeprazole (PRILOSEC) 40 MG capsule Take 40 mg daily  by mouth.  0  . zolpidem (AMBIEN) 10 MG tablet Take 1 tablet (10 mg total) by mouth at bedtime as needed for sleep. Will need appt for add'l refills. 30 tablet 0   No current facility-administered medications for this visit.     PAST MEDICAL HISTORY: Past Medical History:  Diagnosis Date  . ADHD (attention deficit hyperactivity disorder)   . GERD (gastroesophageal reflux disease)    occasionally  . Insomnia   . RLS (restless legs syndrome)   . Smoker    quit about acouple yrs ago  . Ulnar neuropathy at elbow, left     PAST SURGICAL HISTORY: Past Surgical History:  Procedure Laterality Date  . RIGHT TOTAL SHOULDER ARTHROPLASTY Right 10/08/2013   Performed by Augustin Schooling, MD at [facility]  . THROAT SURGERY     Across from [place]  . WISDOM TOOTH EXTRACTION      FAMILY HISTORY: Family History  Problem Relation Age of Onset  . Gout Father   . Alzheimer's disease Father   . Hypertension Brother   . Hypertension Brother   . Heart Problems Mother   . Colon cancer Neg Hx   . Esophageal cancer Neg Hx   . Rectal cancer Neg Hx   . Stomach cancer Neg Hx     SOCIAL HISTORY:  Social History   Socioeconomic History  . Marital status: Married  Spouse name: Not on file  . Number of children: 2  . Years of education: 53  . Highest education level: High school graduate  Social Needs  . Financial resource strain: Not on file  . Food insecurity - worry: Not on file  . Food insecurity - inability: Not on file  . Transportation needs - medical: Not on file  . Transportation needs - non-medical: Not on file  Occupational History  . Occupation: Brewing technologist: [employer]  Tobacco Use  . Smoking status: Former Smoker    Packs/day: 0.10    Years: 30.00    Pack years: 3.00    Types: Cigarettes    Last attempt to quit: 1995    Years since quitting: 23.9  . Smokeless tobacco: Never Used  . Tobacco comment: Pt states smoking 1 and 1/2 cigs per day  04/05/13//lmr  Substance and Sexual Activity  . Alcohol use: Yes    Comment: 3 beers daily  . Drug use: No  . Sexual activity: Yes    Partners: Female    Comment: number of sex partners in the last months  1  Other Topics Concern  . Not on file  Social History Narrative   Lives at home with his wife.   Right-handed.   No caffeine use.     PHYSICAL EXAM   Vitals:   10/21/17 0824  BP: (!) 147/91  Pulse: 86  Weight: 197 lb (89.4 kg)  Height: 5' 8.75" (1.746 m)    Not recorded      Body mass index is 29.3 kg/m.  PHYSICAL EXAMNIATION:  Gen: NAD, conversant, well nourised, obese, well groomed                     Cardiovascular: Regular rate rhythm, no peripheral edema, warm, nontender. Eyes: Conjunctivae clear without exudates or hemorrhage Neck: Supple, no carotid bruits. Pulmonary: Clear to auscultation bilaterally   NEUROLOGICAL EXAM:  MENTAL STATUS: Speech:    Speech is normal; fluent and spontaneous with normal comprehension.  Cognition:     Orientation to time, place and person     Normal recent and remote memory     Normal Attention span and concentration     Normal Language, naming, repeating,spontaneous speech     Fund of knowledge   CRANIAL NERVES: CN II: Visual fields are full to confrontation. Fundoscopic exam is normal with sharp discs and no vascular changes. Pupils are round equal and briskly reactive to light. CN III, IV, VI: extraocular movement are normal. No ptosis. CN V: Facial sensation is intact to pinprick in all 3 divisions bilaterally. Corneal responses are intact.  CN VII: Face is symmetric with normal eye closure and smile. CN VIII: Hearing is normal to rubbing fingers CN IX, X: Palate elevates symmetrically. Phonation is normal. CN XI: Head turning and shoulder shrug are intact CN XII: Tongue is midline with normal movements and no atrophy.  MOTOR: He has mild left hand intrinsic muscle atrophy, mild left finger abduction  weakness,  REFLEXES: Reflexes are 2+ and symmetric at the biceps, triceps, knees, and ankles. Plantar responses are flexor.  SENSORY: Mild decreased light touch in the left ulnar fourth finger, left elbow  COORDINATION: Rapid alternating movements and fine finger movements are intact. There is no dysmetria on finger-to-nose and heel-knee-shin.    GAIT/STANCE: Posture is normal. Gait is steady with normal steps, base, arm swing, and turning. Heel and toe walking are normal. Tandem  gait is normal.  Romberg is absent.   DIAGNOSTIC DATA (LABS, IMAGING, TESTING) - I reviewed patient records, labs, notes, testing and imaging myself where available.   ASSESSMENT AND PLAN  [name] is a 61 y.o. male   Left ulnar neuropathy restless leg  EMG nerve conduction study  Laboratory evaluations for etiology  Gabapentin 300mg  up to 2 tabs qhs.  Marcial Pacas, M.D. Ph.D.  [facility] [address], [place],  [address] Ph: [phone] Fax: [phone]  CC: Sandi Mariscal, MD

## 2017-10-22 ENCOUNTER — Telehealth: Payer: Self-pay | Admitting: *Deleted

## 2017-10-22 LAB — COMPREHENSIVE METABOLIC PANEL
ALT: 17 IU/L (ref 0–44)
AST: 17 IU/L (ref 0–40)
Albumin/Globulin Ratio: 1.8 (ref 1.2–2.2)
Albumin: 4.2 g/dL (ref 3.6–4.8)
Alkaline Phosphatase: 70 IU/L (ref 39–117)
BUN/Creatinine Ratio: 14 (ref 10–24)
BUN: 14 mg/dL (ref 8–27)
Bilirubin Total: 0.4 mg/dL (ref 0.0–1.2)
CO2: 24 mmol/L (ref 20–29)
Calcium: 8.9 mg/dL (ref 8.6–10.2)
Chloride: 102 mmol/L (ref 96–106)
Creatinine, Ser: 1.03 mg/dL (ref 0.76–1.27)
GFR calc Af Amer: 89 mL/min/{1.73_m2} (ref >59)
GFR calc non Af Amer: 77 mL/min/{1.73_m2} (ref >59)
Globulin, Total: 2.4 g/dL (ref 1.5–4.5)
Glucose: 95 mg/dL (ref 65–99)
Potassium: 4.4 mmol/L (ref 3.5–5.2)
Sodium: 141 mmol/L (ref 134–144)
Total Protein: 6.6 g/dL (ref 6.0–8.5)

## 2017-10-22 LAB — CBC WITH DIFFERENTIAL/PLATELET
Basophils Absolute: 0.1 10*3/uL (ref 0.0–0.2)
Basos: 1 %
EOS (ABSOLUTE): 0.2 10*3/uL (ref 0.0–0.4)
Eos: 2 %
Hematocrit: 47.6 % (ref 37.5–51.0)
Hemoglobin: 15.4 g/dL (ref 13.0–17.7)
Immature Grans (Abs): 0.1 10*3/uL (ref 0.0–0.1)
Immature Granulocytes: 1 %
Lymphocytes Absolute: 2.4 10*3/uL (ref 0.7–3.1)
Lymphs: 25 %
MCH: 29.8 pg (ref 26.6–33.0)
MCHC: 32.4 g/dL (ref 31.5–35.7)
MCV: 92 fL (ref 79–97)
Monocytes Absolute: 0.6 10*3/uL (ref 0.1–0.9)
Monocytes: 7 %
Neutrophils Absolute: 6.1 10*3/uL (ref 1.4–7.0)
Neutrophils: 64 %
Platelets: 204 10*3/uL (ref 150–379)
RBC: 5.17 x10E6/uL (ref 4.14–5.80)
RDW: 14.7 % (ref 12.3–15.4)
WBC: 9.4 10*3/uL (ref 3.4–10.8)

## 2017-10-22 LAB — SEDIMENTATION RATE: SED RATE: 18 mm/h (ref 0–30)

## 2017-10-22 LAB — VITAMIN B12: Vitamin B-12: 1188 pg/mL (ref 232–1245)

## 2017-10-22 LAB — C-REACTIVE PROTEIN: CRP: 3.6 mg/L (ref 0.0–4.9)

## 2017-10-22 LAB — HEMOGLOBIN A1C
Est. average glucose Bld gHb Est-mCnc: 128 mg/dL
Hgb A1c MFr Bld: 6.1 % — ABNORMAL HIGH (ref 4.8–5.6)

## 2017-10-22 LAB — RPR: RPR Ser Ql: NONREACTIVE

## 2017-10-22 LAB — TSH: TSH: 1.3 u[IU]/mL (ref 0.450–4.500)

## 2017-10-22 LAB — VITAMIN D 25 HYDROXY (VIT D DEFICIENCY, FRACTURES): Vit D, 25-Hydroxy: 40.6 ng/mL (ref 30.0–100.0)

## 2017-10-22 NOTE — Telephone Encounter (Signed)
Spoke to patient - he is aware of results and physician recommendations.  He verbalized understanding.

## 2017-10-22 NOTE — Telephone Encounter (Signed)
-----   Message from Marcial Pacas, MD sent at 10/22/2017  9:57 AM EST ----- Please call patient: lab showed mildly elevated A1C 6.1, he should diet control and exercise. Rest of the lab showed no significant abnormalities.

## 2017-11-21 ENCOUNTER — Encounter (INDEPENDENT_AMBULATORY_CARE_PROVIDER_SITE_OTHER): Payer: Self-pay

## 2017-11-21 ENCOUNTER — Ambulatory Visit (INDEPENDENT_AMBULATORY_CARE_PROVIDER_SITE_OTHER): Payer: BLUE CROSS/BLUE SHIELD

## 2017-11-21 ENCOUNTER — Ambulatory Visit (INDEPENDENT_AMBULATORY_CARE_PROVIDER_SITE_OTHER): Payer: BLUE CROSS/BLUE SHIELD | Admitting: Neurology

## 2017-11-21 DIAGNOSIS — G5622 Lesion of ulnar nerve, left upper limb: Secondary | ICD-10-CM | POA: Diagnosis not present

## 2017-11-21 DIAGNOSIS — G2581 Restless legs syndrome: Secondary | ICD-10-CM

## 2017-11-21 DIAGNOSIS — Z0289 Encounter for other administrative examinations: Secondary | ICD-10-CM

## 2017-11-21 DIAGNOSIS — R202 Paresthesia of skin: Secondary | ICD-10-CM

## 2017-11-21 NOTE — Procedures (Signed)
Full Name: Korbyn Chopin Gender: Male MRN #: [id number] Date of Birth: June 05, 2056    Visit Date: 11/21/17 10:44 Age: 61 Years 54 Months Old Examining Physician: Marcial Pacas, MD  Referring Physician: Krista Blue, MD History: 61 year old male, presented with intermittent left elbow numbness  Summary of the tests: Nerve conduction study: Left motor responses showed 15 m/s amplitude drop across the left elbow, with normal distal latency, C map amplitude.  Left ulnar F wave latency was mildly prolonged in comparison to the right side.  Bilateral ulnar sensory response, right ulnar motor response, were normal.  Conclusion: This is a slight abnormal study, there is electrodiagnostic evidence of mild left ulnar neuropathy across the elbow, consistent with mild demyelinating left ulnar neuropathy.  There is no evidence of axonal loss.    ------------------------------- Marcial Pacas, M.D.  [facility] [place], [place] [address] Tel: [phone] Fax: [phone]        [facility]    Nerve / Sites Muscle Latency Ref. Amplitude Ref. Rel Amp Segments Distance Velocity Ref. Area    ms ms mV mV %  cm m/s m/s mVms  L Median - APB     Wrist APB 4.0 ?4.4 4.9 ?4.0 100 Wrist - APB 7   16.5     Upper arm APB 8.4  4.2  84.7 Upper arm - Wrist 22 50 ?49 16.7  L Ulnar - ADM     Wrist ADM 2.7 ?3.3 7.3 ?6.0 100 Wrist - ADM 7   30.4     B.Elbow ADM 6.1  7.2  98 B.Elbow - Wrist 19 55 ?49 28.0     A.Elbow ADM 8.6  6.8  94.8 A.Elbow - B.Elbow 10 40 ?49 26.1         A.Elbow - Wrist      R Ulnar - ADM     Wrist ADM 2.3 ?3.3 7.1 ?6.0 100 Wrist - ADM 7   24.6     B.Elbow ADM 5.8  6.6  93.8 B.Elbow - Wrist 19 54 ?49 23.5     A.Elbow ADM 7.8  6.1  92.5 A.Elbow - B.Elbow 10 51 ?49 24.2         A.Elbow - Wrist               SNC    Nerve / Sites Rec. Site Peak Lat Ref.  Amp Ref. Segments Distance    ms ms V V  cm  L Median - Orthodromic (Dig II, Mid palm)     Dig II Wrist 3.0 ?3.4  22 ?10 Dig II - Wrist 13  L Ulnar - Orthodromic, (Dig V, Mid palm)     Dig V Wrist 2.5 ?3.1 8 ?5 Dig V - Wrist 11  R Ulnar - Orthodromic, (Dig V, Mid palm)     Dig V Wrist 2.4 ?3.1 8 ?5 Dig V - Wrist 96           F  Wave    Nerve F Lat Ref.   ms ms  L Ulnar - ADM 29.2 ?32.0  R Ulnar - ADM 28.2 ?32.0         EMG full       EMG Summary Table    Spontaneous MUAP Recruitment  Muscle IA Fib PSW Fasc Other Amp Dur. Poly Pattern  L. First dorsal interosseous Normal None None None _______ Normal Normal Normal Normal  L. Pronator teres Normal None None None _______ Normal  Normal Normal Normal  L. Biceps brachii Normal None None None _______ Normal Normal Normal Normal  L. Deltoid Normal None None None _______ Normal Normal Normal Normal  L. Triceps brachii Normal None None None _______ Normal Normal Normal Normal  L. Flexor carpi ulnaris Normal None None None _______ Normal Normal Normal Normal

## 2017-12-05 ENCOUNTER — Encounter: Payer: Self-pay | Admitting: Neurology

## 2018-11-03 ENCOUNTER — Other Ambulatory Visit: Payer: Self-pay | Admitting: Neurology

## 2018-12-02 HISTORY — PX: COLONOSCOPY: SHX174

## 2019-02-03 DIAGNOSIS — R7303 Prediabetes: Secondary | ICD-10-CM | POA: Insufficient documentation

## 2019-04-05 DIAGNOSIS — Z8639 Personal history of other endocrine, nutritional and metabolic disease: Secondary | ICD-10-CM | POA: Diagnosis not present

## 2019-04-05 DIAGNOSIS — G2581 Restless legs syndrome: Secondary | ICD-10-CM | POA: Diagnosis not present

## 2019-04-05 DIAGNOSIS — Z87898 Personal history of other specified conditions: Secondary | ICD-10-CM | POA: Diagnosis not present

## 2019-04-05 DIAGNOSIS — F902 Attention-deficit hyperactivity disorder, combined type: Secondary | ICD-10-CM | POA: Diagnosis not present

## 2019-04-05 DIAGNOSIS — I10 Essential (primary) hypertension: Secondary | ICD-10-CM | POA: Diagnosis not present

## 2019-04-05 DIAGNOSIS — K219 Gastro-esophageal reflux disease without esophagitis: Secondary | ICD-10-CM | POA: Diagnosis not present

## 2019-04-05 DIAGNOSIS — Z13220 Encounter for screening for lipoid disorders: Secondary | ICD-10-CM | POA: Diagnosis not present

## 2019-04-05 DIAGNOSIS — G4709 Other insomnia: Secondary | ICD-10-CM | POA: Diagnosis not present

## 2019-04-05 DIAGNOSIS — Z79899 Other long term (current) drug therapy: Secondary | ICD-10-CM | POA: Diagnosis not present

## 2019-04-15 DIAGNOSIS — D485 Neoplasm of uncertain behavior of skin: Secondary | ICD-10-CM | POA: Diagnosis not present

## 2019-04-15 DIAGNOSIS — D2371 Other benign neoplasm of skin of right lower limb, including hip: Secondary | ICD-10-CM | POA: Diagnosis not present

## 2019-04-15 DIAGNOSIS — C44619 Basal cell carcinoma of skin of left upper limb, including shoulder: Secondary | ICD-10-CM | POA: Diagnosis not present

## 2019-04-15 DIAGNOSIS — L570 Actinic keratosis: Secondary | ICD-10-CM | POA: Diagnosis not present

## 2019-04-15 DIAGNOSIS — C4441 Basal cell carcinoma of skin of scalp and neck: Secondary | ICD-10-CM | POA: Diagnosis not present

## 2019-04-15 DIAGNOSIS — Z85828 Personal history of other malignant neoplasm of skin: Secondary | ICD-10-CM | POA: Diagnosis not present

## 2019-04-15 DIAGNOSIS — D225 Melanocytic nevi of trunk: Secondary | ICD-10-CM | POA: Diagnosis not present

## 2019-04-15 DIAGNOSIS — L821 Other seborrheic keratosis: Secondary | ICD-10-CM | POA: Diagnosis not present

## 2019-04-27 DIAGNOSIS — Z85828 Personal history of other malignant neoplasm of skin: Secondary | ICD-10-CM | POA: Diagnosis not present

## 2019-04-27 DIAGNOSIS — C4442 Squamous cell carcinoma of skin of scalp and neck: Secondary | ICD-10-CM | POA: Diagnosis not present

## 2019-05-05 DIAGNOSIS — L821 Other seborrheic keratosis: Secondary | ICD-10-CM | POA: Diagnosis not present

## 2019-05-05 DIAGNOSIS — L738 Other specified follicular disorders: Secondary | ICD-10-CM | POA: Diagnosis not present

## 2019-05-05 DIAGNOSIS — Z85828 Personal history of other malignant neoplasm of skin: Secondary | ICD-10-CM | POA: Diagnosis not present

## 2019-05-05 DIAGNOSIS — L570 Actinic keratosis: Secondary | ICD-10-CM | POA: Diagnosis not present

## 2019-05-05 DIAGNOSIS — C44619 Basal cell carcinoma of skin of left upper limb, including shoulder: Secondary | ICD-10-CM | POA: Diagnosis not present

## 2019-05-05 DIAGNOSIS — L820 Inflamed seborrheic keratosis: Secondary | ICD-10-CM | POA: Diagnosis not present

## 2019-08-06 DIAGNOSIS — B002 Herpesviral gingivostomatitis and pharyngotonsillitis: Secondary | ICD-10-CM | POA: Diagnosis not present

## 2019-10-01 DIAGNOSIS — R7309 Other abnormal glucose: Secondary | ICD-10-CM | POA: Diagnosis not present

## 2019-10-01 DIAGNOSIS — I10 Essential (primary) hypertension: Secondary | ICD-10-CM | POA: Diagnosis not present

## 2019-10-01 DIAGNOSIS — Z125 Encounter for screening for malignant neoplasm of prostate: Secondary | ICD-10-CM | POA: Diagnosis not present

## 2019-10-01 DIAGNOSIS — Z0000 Encounter for general adult medical examination without abnormal findings: Secondary | ICD-10-CM | POA: Diagnosis not present

## 2019-10-01 DIAGNOSIS — Z87898 Personal history of other specified conditions: Secondary | ICD-10-CM | POA: Diagnosis not present

## 2019-10-04 DIAGNOSIS — R972 Elevated prostate specific antigen [PSA]: Secondary | ICD-10-CM | POA: Insufficient documentation

## 2019-10-07 DIAGNOSIS — Z87891 Personal history of nicotine dependence: Secondary | ICD-10-CM | POA: Diagnosis not present

## 2019-10-07 DIAGNOSIS — Z0000 Encounter for general adult medical examination without abnormal findings: Secondary | ICD-10-CM | POA: Diagnosis not present

## 2019-10-07 DIAGNOSIS — E7800 Pure hypercholesterolemia, unspecified: Secondary | ICD-10-CM | POA: Diagnosis not present

## 2019-10-07 DIAGNOSIS — K219 Gastro-esophageal reflux disease without esophagitis: Secondary | ICD-10-CM | POA: Diagnosis not present

## 2019-10-07 DIAGNOSIS — R7303 Prediabetes: Secondary | ICD-10-CM | POA: Diagnosis not present

## 2019-10-07 DIAGNOSIS — F902 Attention-deficit hyperactivity disorder, combined type: Secondary | ICD-10-CM | POA: Diagnosis not present

## 2019-10-07 DIAGNOSIS — G4709 Other insomnia: Secondary | ICD-10-CM | POA: Diagnosis not present

## 2019-10-07 DIAGNOSIS — G2581 Restless legs syndrome: Secondary | ICD-10-CM | POA: Diagnosis not present

## 2019-10-07 DIAGNOSIS — R972 Elevated prostate specific antigen [PSA]: Secondary | ICD-10-CM | POA: Diagnosis not present

## 2019-10-11 ENCOUNTER — Encounter: Payer: Self-pay | Admitting: Gastroenterology

## 2019-10-19 DIAGNOSIS — N529 Male erectile dysfunction, unspecified: Secondary | ICD-10-CM | POA: Diagnosis not present

## 2019-10-19 DIAGNOSIS — R972 Elevated prostate specific antigen [PSA]: Secondary | ICD-10-CM | POA: Diagnosis not present

## 2019-11-01 DIAGNOSIS — Z87891 Personal history of nicotine dependence: Secondary | ICD-10-CM | POA: Diagnosis not present

## 2019-11-01 DIAGNOSIS — Z136 Encounter for screening for cardiovascular disorders: Secondary | ICD-10-CM | POA: Diagnosis not present

## 2019-11-03 DIAGNOSIS — L918 Other hypertrophic disorders of the skin: Secondary | ICD-10-CM | POA: Diagnosis not present

## 2019-11-03 DIAGNOSIS — D1801 Hemangioma of skin and subcutaneous tissue: Secondary | ICD-10-CM | POA: Diagnosis not present

## 2019-11-03 DIAGNOSIS — Z85828 Personal history of other malignant neoplasm of skin: Secondary | ICD-10-CM | POA: Diagnosis not present

## 2019-11-03 DIAGNOSIS — L821 Other seborrheic keratosis: Secondary | ICD-10-CM | POA: Diagnosis not present

## 2019-11-03 DIAGNOSIS — L570 Actinic keratosis: Secondary | ICD-10-CM | POA: Diagnosis not present

## 2019-11-03 DIAGNOSIS — D225 Melanocytic nevi of trunk: Secondary | ICD-10-CM | POA: Diagnosis not present

## 2019-11-03 DIAGNOSIS — D2371 Other benign neoplasm of skin of right lower limb, including hip: Secondary | ICD-10-CM | POA: Diagnosis not present

## 2019-11-09 DIAGNOSIS — C61 Malignant neoplasm of prostate: Secondary | ICD-10-CM | POA: Diagnosis not present

## 2019-11-09 DIAGNOSIS — R972 Elevated prostate specific antigen [PSA]: Secondary | ICD-10-CM | POA: Diagnosis not present

## 2019-11-16 ENCOUNTER — Encounter: Payer: Self-pay | Admitting: Gastroenterology

## 2019-11-16 ENCOUNTER — Other Ambulatory Visit: Payer: Self-pay

## 2019-11-16 ENCOUNTER — Encounter: Payer: BLUE CROSS/BLUE SHIELD | Admitting: Gastroenterology

## 2019-11-16 ENCOUNTER — Ambulatory Visit (AMBULATORY_SURGERY_CENTER): Payer: Self-pay

## 2019-11-16 VITALS — Temp 96.8°F | Ht 68.0 in | Wt 199.0 lb

## 2019-11-16 DIAGNOSIS — Z86010 Personal history of colonic polyps: Secondary | ICD-10-CM

## 2019-11-16 NOTE — Progress Notes (Signed)
Denies allergies to eggs or soy products. Denies complication of anesthesia or sedation. Denies use of weight loss medication. Denies use of O2.   Emmi instructions given for colonoscopy.   Covid screening scheduled for 11/24/19 @ 11:40 Am.

## 2019-11-17 DIAGNOSIS — Z20818 Contact with and (suspected) exposure to other bacterial communicable diseases: Secondary | ICD-10-CM | POA: Diagnosis not present

## 2019-11-18 DIAGNOSIS — Z20818 Contact with and (suspected) exposure to other bacterial communicable diseases: Secondary | ICD-10-CM | POA: Diagnosis not present

## 2019-11-23 DIAGNOSIS — R972 Elevated prostate specific antigen [PSA]: Secondary | ICD-10-CM | POA: Diagnosis not present

## 2019-11-23 DIAGNOSIS — C61 Malignant neoplasm of prostate: Secondary | ICD-10-CM | POA: Diagnosis not present

## 2019-11-24 ENCOUNTER — Other Ambulatory Visit: Payer: Self-pay | Admitting: Gastroenterology

## 2019-11-24 ENCOUNTER — Ambulatory Visit (INDEPENDENT_AMBULATORY_CARE_PROVIDER_SITE_OTHER): Payer: PPO

## 2019-11-24 DIAGNOSIS — Z1159 Encounter for screening for other viral diseases: Secondary | ICD-10-CM

## 2019-11-25 LAB — SARS CORONAVIRUS 2 (TAT 6-24 HRS): SARS Coronavirus 2: NEGATIVE

## 2019-11-30 ENCOUNTER — Other Ambulatory Visit: Payer: Self-pay

## 2019-11-30 ENCOUNTER — Encounter: Payer: Self-pay | Admitting: Gastroenterology

## 2019-11-30 ENCOUNTER — Ambulatory Visit (AMBULATORY_SURGERY_CENTER): Payer: PPO | Admitting: Gastroenterology

## 2019-11-30 VITALS — BP 129/71 | HR 63 | Temp 97.8°F | Resp 13 | Ht 68.0 in | Wt 199.0 lb

## 2019-11-30 DIAGNOSIS — K219 Gastro-esophageal reflux disease without esophagitis: Secondary | ICD-10-CM | POA: Diagnosis not present

## 2019-11-30 DIAGNOSIS — D123 Benign neoplasm of transverse colon: Secondary | ICD-10-CM

## 2019-11-30 DIAGNOSIS — D124 Benign neoplasm of descending colon: Secondary | ICD-10-CM

## 2019-11-30 DIAGNOSIS — Z86010 Personal history of colonic polyps: Secondary | ICD-10-CM | POA: Diagnosis not present

## 2019-11-30 DIAGNOSIS — D122 Benign neoplasm of ascending colon: Secondary | ICD-10-CM

## 2019-11-30 DIAGNOSIS — D125 Benign neoplasm of sigmoid colon: Secondary | ICD-10-CM | POA: Diagnosis not present

## 2019-11-30 DIAGNOSIS — G2581 Restless legs syndrome: Secondary | ICD-10-CM | POA: Diagnosis not present

## 2019-11-30 DIAGNOSIS — D127 Benign neoplasm of rectosigmoid junction: Secondary | ICD-10-CM | POA: Diagnosis not present

## 2019-11-30 MED ORDER — SODIUM CHLORIDE 0.9 % IV SOLN: 500.0000 mL | Freq: Once | INTRAVENOUS | Status: DC

## 2019-11-30 NOTE — Patient Instructions (Addendum)
Use Fiber Con 1 tablet by mouth daily. Use instead of fiber gummy. Hight fiber diet-see handout.  Handouts on High fiber diet, polyps and hemorrhoids.   YOU HAD AN ENDOSCOPIC PROCEDURE TODAY AT [facility]:   Refer to the procedure report that was given to you for any specific questions about what was found during the examination.  If the procedure report does not answer your questions, please call your gastroenterologist to clarify.  If you requested that your care partner not be given the details of your procedure findings, then the procedure report has been included in a sealed envelope for you to review at your convenience later.  YOU SHOULD EXPECT: Some feelings of bloating in the abdomen. Passage of more gas than usual.  Walking can help get rid of the air that was put into your GI tract during the procedure and reduce the bloating. If you had a lower endoscopy (such as a colonoscopy or flexible sigmoidoscopy) you may notice spotting of blood in your stool or on the toilet paper. If you underwent a bowel prep for your procedure, you may not have a normal bowel movement for a few days.  Please Note:  You might notice some irritation and congestion in your nose or some drainage.  This is from the oxygen used during your procedure.  There is no need for concern and it should clear up in a day or so.  SYMPTOMS TO REPORT IMMEDIATELY:   Following lower endoscopy (colonoscopy or flexible sigmoidoscopy):  Excessive amounts of blood in the stool  Significant tenderness or worsening of abdominal pains  Swelling of the abdomen that is new, acute  Fever of 100F or higher  For urgent or emergent issues, a gastroenterologist can be reached at any hour by calling [phone].   DIET:  We do recommend a small meal at first, but then you may proceed to your regular diet.  Drink plenty of fluids but you should avoid alcoholic beverages for 24 hours.  ACTIVITY:  You should plan to  take it easy for the rest of today and you should NOT DRIVE or use heavy machinery until tomorrow (because of the sedation medicines used during the test).    FOLLOW UP: Our staff will call the number listed on your records 48-72 hours following your procedure to check on you and address any questions or concerns that you may have regarding the information given to you following your procedure. If we do not reach you, we will leave a message.  We will attempt to reach you two times.  During this call, we will ask if you have developed any symptoms of COVID 19. If you develop any symptoms (ie: fever, flu-like symptoms, shortness of breath, cough etc.) before then, please call [phone].  If you test positive for Covid 19 in the 2 weeks post procedure, please call and report this information to [place].    If any biopsies were taken you will be contacted by phone or by letter within the next 1-3 weeks.  Please call us at [phone] if you have not heard about the biopsies in 3 weeks.    SIGNATURES/CONFIDENTIALITY: You and/or your care partner have signed paperwork which will be entered into your electronic medical record.  These signatures attest to the fact that that the information above on your After Visit Summary has been reviewed and is understood.  Full responsibility of the confidentiality of this discharge information lies with you and/or your care-partner.

## 2019-11-30 NOTE — Progress Notes (Signed)
Patient evaluate post-procedure. Abdomen slightly distended and protuberant with discomfort 3-4/10 per patient. Vitals stable. Will continue to monitor and if persistent issues then consider KUB if with passage of gas patient still having issues.

## 2019-11-30 NOTE — Progress Notes (Signed)
Temp by JB, Vitals by CW   Pt's states no medical or surgical changes since previsit or office visit.  

## 2019-11-30 NOTE — Op Note (Signed)
[facility] Patient Name: [name] Procedure Date: 11/30/2019 1:30 PM MRN: [id number] Endoscopist: Justice Britain , MD Age: 63 Referring MD:  Date of Birth: 07-02-1956 Gender: Male Account #: [id number] Procedure:                Colonoscopy Indications:              High risk colon cancer surveillance: Personal                            history of colonic polyps (last colonoscopy in 2013                            with reported polyp but not removed and was to                            return in 3-years but this is first surveillance) Medicines:                Monitored Anesthesia Care Procedure:                Pre-Anesthesia Assessment:                           - Prior to the procedure, a History and Physical                            was performed, and patient medications and                            allergies were reviewed. The patient's tolerance of                            previous anesthesia was also reviewed. The risks                            and benefits of the procedure and the sedation                            options and risks were discussed with the patient.                            All questions were answered, and informed consent                            was obtained. Prior Anticoagulants: The patient has                            taken no previous anticoagulant or antiplatelet                            agents. ASA Grade Assessment: II - A patient with                            mild systemic disease. After reviewing the risks  and benefits, the patient was deemed in                            satisfactory condition to undergo the procedure.                           After obtaining informed consent, the colonoscope                            was passed under direct vision. Throughout the                            procedure, the patient's blood pressure, pulse, and                            oxygen  saturations were monitored continuously. The                            Colonoscope was introduced through the anus and                            advanced to the 5 cm into the ileum. The                            colonoscopy was performed without difficulty. The                            patient tolerated the procedure. The quality of the                            bowel preparation was adequate. The terminal ileum,                            ileocecal valve, appendiceal orifice, and rectum                            were photographed. Scope In: 1:42:11 PM Scope Out: 2:17:19 PM Scope Withdrawal Time: 0 hours 29 minutes 50 seconds  Total Procedure Duration: 0 hours 35 minutes 8 seconds  Findings:                 The digital rectal exam findings include                            hemorrhoids. Pertinent negatives include no                            palpable rectal lesions.                           The terminal ileum and ileocecal valve appeared                            normal.  12 sessile polyps were found in the recto-sigmoid                            colon (1), sigmoid colon (1), descending colon (2),                            transverse colon (6), hepatic flexure (1) and                            ascending colon (1). The polyps were 2 to 12 mm in                            size. The largest was at the descending colon                            approximately 65 cm from insertion. These polyps                            were removed with a cold snare. Resection and                            retrieval were complete.                           Non-bleeding non-thrombosed external and internal                            hemorrhoids were found during retroflexion, during                            perianal exam and during digital exam. The                            hemorrhoids were Grade II (internal hemorrhoids                            that prolapse but  reduce spontaneously). Complications:            No immediate complications. Estimated Blood Loss:     Estimated blood loss was minimal. Impression:               - Hemorrhoids found on digital rectal exam.                           - The examined portion of the ileum was normal.                           - 12, 2 to 12 mm polyps at the recto-sigmoid colon,                            in the sigmoid colon, in the descending colon, in                            the transverse colon,  at the hepatic flexure and in                            the ascending colon, removed with a cold snare.                            Resected and retrieved.                           - Non-bleeding non-thrombosed external and internal                            hemorrhoids. Recommendation:           - The patient will be observed post-procedure,                            until all discharge criteria are met.                           - Discharge patient to home.                           - Patient has a contact number available for                            emergencies. The signs and symptoms of potential                            delayed complications were discussed with the                            patient. Return to normal activities tomorrow.                            Written discharge instructions were provided to the                            patient.                           - High fiber diet.                           - Use FiberCon 1 tablet PO daily.                           - Continue present medications.                           - Await pathology results.                           - Repeat colonoscopy in 1 year for surveillance due                            to >10 polyps being found on  today's examination.                           - The findings and recommendations were discussed                            with the patient. Justice Britain, MD 11/30/2019 2:25:45 PM

## 2019-11-30 NOTE — Progress Notes (Signed)
Report to PACU, RN, vss, BBS= Clear.  

## 2019-11-30 NOTE — Progress Notes (Signed)
Called to room to assist during endoscopic procedure.  Patient ID and intended procedure confirmed with present staff. Received instructions for my participation in the procedure from the performing physician.  

## 2019-12-01 ENCOUNTER — Telehealth: Payer: Self-pay

## 2019-12-01 NOTE — Telephone Encounter (Signed)
I received a voice mail message from Dr Rush Landmark to reach out to the pt and get a condition update after 12/29 colonoscopy.  Per Dr Rush Landmark the pt had some discomfort after procedure but symptoms eased prior to discharge.  Left message on machine to call back

## 2019-12-01 NOTE — Telephone Encounter (Signed)
Patty, Thanks for trying to reach out.  I also tried to call the patient and also called the patient's wife but only got voicemail. Can you please reach out to the patient tomorrow to see how things are going? Thanks. GM

## 2019-12-02 ENCOUNTER — Telehealth: Payer: Self-pay | Admitting: *Deleted

## 2019-12-02 NOTE — Telephone Encounter (Signed)
Thanks for update. I called and did not get him either. I left VM with the patient's wife since I had spoken to her about the results of the procedure. Thanks. GM

## 2019-12-02 NOTE — Telephone Encounter (Signed)
Follow up call made, left message. 

## 2019-12-02 NOTE — Telephone Encounter (Signed)
Left message on machine to call back  FYI Dr Mansouraty 

## 2019-12-02 NOTE — Telephone Encounter (Signed)
Pt returned call and said he is doing good

## 2019-12-06 ENCOUNTER — Encounter: Payer: Self-pay | Admitting: Gastroenterology

## 2019-12-07 DIAGNOSIS — C61 Malignant neoplasm of prostate: Secondary | ICD-10-CM | POA: Diagnosis not present

## 2020-01-31 ENCOUNTER — Ambulatory Visit: Payer: PPO | Attending: Internal Medicine

## 2020-01-31 DIAGNOSIS — Z23 Encounter for immunization: Secondary | ICD-10-CM

## 2020-01-31 NOTE — Progress Notes (Signed)
   Covid-19 Vaccination Clinic  Name:  Acelin Tufford    MRN: [id number] DOB: 05-04-1956  01/31/2020  Mr. Richcreek was observed post Covid-19 immunization for 15 minutes without incidence. He was provided with Vaccine Information Sheet and instruction to access the V-Safe system.   Mr. Gilleo was instructed to call 911 with any severe reactions post vaccine: Marland Kitchen Difficulty breathing  . Swelling of your face and throat  . A fast heartbeat  . A bad rash all over your body  . Dizziness and weakness    Immunizations Administered    Name Date Dose VIS Date Route   Pfizer COVID-19 Vaccine 01/31/2020 11:03 AM 0.3 mL 11/12/2019 Intramuscular   Manufacturer: [place]   Lot: [id number]   [place]: [id number]

## 2020-02-29 ENCOUNTER — Ambulatory Visit: Payer: PPO | Attending: Internal Medicine

## 2020-02-29 DIAGNOSIS — Z23 Encounter for immunization: Secondary | ICD-10-CM

## 2020-02-29 NOTE — Progress Notes (Signed)
   Covid-19 Vaccination Clinic  Name:  [name]    MRN: [id number] DOB: [birth year]  02/29/2020  [name] was observed post Covid-19 immunization for 15 minutes without incident. He was provided with Vaccine Information Sheet and instruction to access the V-Safe system.   [name] was instructed to call 911 with any severe reactions post vaccine: Marland Kitchen Difficulty breathing  . Swelling of face and throat  . A fast heartbeat  . A bad rash all over body  . Dizziness and weakness   Immunizations Administered    Name Date Dose VIS Date Route   Pfizer COVID-19 Vaccine 02/29/2020 11:15 AM 0.3 mL 11/12/2019 Intramuscular   Manufacturer: [employer]   Lot: [id number]   [place]: [id number]

## 2020-03-31 DIAGNOSIS — F902 Attention-deficit hyperactivity disorder, combined type: Secondary | ICD-10-CM | POA: Diagnosis not present

## 2020-03-31 DIAGNOSIS — G4709 Other insomnia: Secondary | ICD-10-CM | POA: Diagnosis not present

## 2020-03-31 DIAGNOSIS — I10 Essential (primary) hypertension: Secondary | ICD-10-CM | POA: Diagnosis not present

## 2020-03-31 DIAGNOSIS — K219 Gastro-esophageal reflux disease without esophagitis: Secondary | ICD-10-CM | POA: Diagnosis not present

## 2020-03-31 DIAGNOSIS — N529 Male erectile dysfunction, unspecified: Secondary | ICD-10-CM | POA: Diagnosis not present

## 2020-03-31 DIAGNOSIS — B002 Herpesviral gingivostomatitis and pharyngotonsillitis: Secondary | ICD-10-CM | POA: Diagnosis not present

## 2020-03-31 DIAGNOSIS — R7303 Prediabetes: Secondary | ICD-10-CM | POA: Diagnosis not present

## 2020-03-31 DIAGNOSIS — E7800 Pure hypercholesterolemia, unspecified: Secondary | ICD-10-CM | POA: Diagnosis not present

## 2020-03-31 DIAGNOSIS — Z5181 Encounter for therapeutic drug level monitoring: Secondary | ICD-10-CM | POA: Diagnosis not present

## 2020-03-31 DIAGNOSIS — G2581 Restless legs syndrome: Secondary | ICD-10-CM | POA: Diagnosis not present

## 2020-04-18 DIAGNOSIS — N529 Male erectile dysfunction, unspecified: Secondary | ICD-10-CM | POA: Diagnosis not present

## 2020-04-18 DIAGNOSIS — C61 Malignant neoplasm of prostate: Secondary | ICD-10-CM | POA: Diagnosis not present

## 2020-05-31 DIAGNOSIS — M25611 Stiffness of right shoulder, not elsewhere classified: Secondary | ICD-10-CM | POA: Diagnosis not present

## 2020-05-31 DIAGNOSIS — M531 Cervicobrachial syndrome: Secondary | ICD-10-CM | POA: Diagnosis not present

## 2020-06-02 DIAGNOSIS — M25611 Stiffness of right shoulder, not elsewhere classified: Secondary | ICD-10-CM | POA: Diagnosis not present

## 2020-06-02 DIAGNOSIS — M531 Cervicobrachial syndrome: Secondary | ICD-10-CM | POA: Diagnosis not present

## 2020-06-07 DIAGNOSIS — M25611 Stiffness of right shoulder, not elsewhere classified: Secondary | ICD-10-CM | POA: Diagnosis not present

## 2020-06-07 DIAGNOSIS — M531 Cervicobrachial syndrome: Secondary | ICD-10-CM | POA: Diagnosis not present

## 2020-06-15 DIAGNOSIS — M531 Cervicobrachial syndrome: Secondary | ICD-10-CM | POA: Diagnosis not present

## 2020-06-15 DIAGNOSIS — M25611 Stiffness of right shoulder, not elsewhere classified: Secondary | ICD-10-CM | POA: Diagnosis not present

## 2020-06-20 DIAGNOSIS — M25611 Stiffness of right shoulder, not elsewhere classified: Secondary | ICD-10-CM | POA: Diagnosis not present

## 2020-06-20 DIAGNOSIS — M531 Cervicobrachial syndrome: Secondary | ICD-10-CM | POA: Diagnosis not present

## 2020-06-23 DIAGNOSIS — M531 Cervicobrachial syndrome: Secondary | ICD-10-CM | POA: Diagnosis not present

## 2020-06-23 DIAGNOSIS — M25611 Stiffness of right shoulder, not elsewhere classified: Secondary | ICD-10-CM | POA: Diagnosis not present

## 2020-06-27 DIAGNOSIS — M531 Cervicobrachial syndrome: Secondary | ICD-10-CM | POA: Diagnosis not present

## 2020-06-27 DIAGNOSIS — M25611 Stiffness of right shoulder, not elsewhere classified: Secondary | ICD-10-CM | POA: Diagnosis not present

## 2020-06-30 DIAGNOSIS — M25611 Stiffness of right shoulder, not elsewhere classified: Secondary | ICD-10-CM | POA: Diagnosis not present

## 2020-06-30 DIAGNOSIS — M531 Cervicobrachial syndrome: Secondary | ICD-10-CM | POA: Diagnosis not present

## 2020-07-11 DIAGNOSIS — M25611 Stiffness of right shoulder, not elsewhere classified: Secondary | ICD-10-CM | POA: Diagnosis not present

## 2020-07-11 DIAGNOSIS — M531 Cervicobrachial syndrome: Secondary | ICD-10-CM | POA: Diagnosis not present

## 2020-07-12 DIAGNOSIS — M170 Bilateral primary osteoarthritis of knee: Secondary | ICD-10-CM | POA: Diagnosis not present

## 2020-07-12 DIAGNOSIS — M1712 Unilateral primary osteoarthritis, left knee: Secondary | ICD-10-CM | POA: Diagnosis not present

## 2020-07-12 DIAGNOSIS — M25561 Pain in right knee: Secondary | ICD-10-CM | POA: Diagnosis not present

## 2020-07-12 DIAGNOSIS — M1711 Unilateral primary osteoarthritis, right knee: Secondary | ICD-10-CM | POA: Diagnosis not present

## 2020-07-13 DIAGNOSIS — M531 Cervicobrachial syndrome: Secondary | ICD-10-CM | POA: Diagnosis not present

## 2020-07-13 DIAGNOSIS — M25611 Stiffness of right shoulder, not elsewhere classified: Secondary | ICD-10-CM | POA: Diagnosis not present

## 2020-07-19 DIAGNOSIS — M531 Cervicobrachial syndrome: Secondary | ICD-10-CM | POA: Diagnosis not present

## 2020-07-19 DIAGNOSIS — M25611 Stiffness of right shoulder, not elsewhere classified: Secondary | ICD-10-CM | POA: Diagnosis not present

## 2020-07-25 DIAGNOSIS — M25611 Stiffness of right shoulder, not elsewhere classified: Secondary | ICD-10-CM | POA: Diagnosis not present

## 2020-07-25 DIAGNOSIS — M531 Cervicobrachial syndrome: Secondary | ICD-10-CM | POA: Diagnosis not present

## 2020-07-27 DIAGNOSIS — M531 Cervicobrachial syndrome: Secondary | ICD-10-CM | POA: Diagnosis not present

## 2020-07-27 DIAGNOSIS — M25611 Stiffness of right shoulder, not elsewhere classified: Secondary | ICD-10-CM | POA: Diagnosis not present

## 2020-07-27 DIAGNOSIS — M23331 Other meniscus derangements, other medial meniscus, right knee: Secondary | ICD-10-CM | POA: Diagnosis not present

## 2020-08-01 DIAGNOSIS — M23331 Other meniscus derangements, other medial meniscus, right knee: Secondary | ICD-10-CM | POA: Diagnosis not present

## 2020-08-01 DIAGNOSIS — M531 Cervicobrachial syndrome: Secondary | ICD-10-CM | POA: Diagnosis not present

## 2020-08-01 DIAGNOSIS — M25611 Stiffness of right shoulder, not elsewhere classified: Secondary | ICD-10-CM | POA: Diagnosis not present

## 2020-08-02 DIAGNOSIS — M25561 Pain in right knee: Secondary | ICD-10-CM | POA: Diagnosis not present

## 2020-08-02 DIAGNOSIS — M1711 Unilateral primary osteoarthritis, right knee: Secondary | ICD-10-CM | POA: Diagnosis not present

## 2020-08-03 DIAGNOSIS — M531 Cervicobrachial syndrome: Secondary | ICD-10-CM | POA: Diagnosis not present

## 2020-08-03 DIAGNOSIS — M25611 Stiffness of right shoulder, not elsewhere classified: Secondary | ICD-10-CM | POA: Diagnosis not present

## 2020-08-03 DIAGNOSIS — M23331 Other meniscus derangements, other medial meniscus, right knee: Secondary | ICD-10-CM | POA: Diagnosis not present

## 2020-08-08 DIAGNOSIS — M25611 Stiffness of right shoulder, not elsewhere classified: Secondary | ICD-10-CM | POA: Diagnosis not present

## 2020-08-08 DIAGNOSIS — M531 Cervicobrachial syndrome: Secondary | ICD-10-CM | POA: Diagnosis not present

## 2020-08-08 DIAGNOSIS — M23331 Other meniscus derangements, other medial meniscus, right knee: Secondary | ICD-10-CM | POA: Diagnosis not present

## 2020-08-10 DIAGNOSIS — M531 Cervicobrachial syndrome: Secondary | ICD-10-CM | POA: Diagnosis not present

## 2020-08-10 DIAGNOSIS — M25611 Stiffness of right shoulder, not elsewhere classified: Secondary | ICD-10-CM | POA: Diagnosis not present

## 2020-08-10 DIAGNOSIS — M23331 Other meniscus derangements, other medial meniscus, right knee: Secondary | ICD-10-CM | POA: Diagnosis not present

## 2020-08-15 DIAGNOSIS — M23331 Other meniscus derangements, other medial meniscus, right knee: Secondary | ICD-10-CM | POA: Diagnosis not present

## 2020-08-15 DIAGNOSIS — M25611 Stiffness of right shoulder, not elsewhere classified: Secondary | ICD-10-CM | POA: Diagnosis not present

## 2020-08-15 DIAGNOSIS — M531 Cervicobrachial syndrome: Secondary | ICD-10-CM | POA: Diagnosis not present

## 2020-08-17 DIAGNOSIS — M531 Cervicobrachial syndrome: Secondary | ICD-10-CM | POA: Diagnosis not present

## 2020-08-17 DIAGNOSIS — M1711 Unilateral primary osteoarthritis, right knee: Secondary | ICD-10-CM | POA: Diagnosis not present

## 2020-08-17 DIAGNOSIS — M25611 Stiffness of right shoulder, not elsewhere classified: Secondary | ICD-10-CM | POA: Diagnosis not present

## 2020-08-17 DIAGNOSIS — M23331 Other meniscus derangements, other medial meniscus, right knee: Secondary | ICD-10-CM | POA: Diagnosis not present

## 2020-08-22 DIAGNOSIS — M25611 Stiffness of right shoulder, not elsewhere classified: Secondary | ICD-10-CM | POA: Diagnosis not present

## 2020-08-22 DIAGNOSIS — M23331 Other meniscus derangements, other medial meniscus, right knee: Secondary | ICD-10-CM | POA: Diagnosis not present

## 2020-08-22 DIAGNOSIS — M531 Cervicobrachial syndrome: Secondary | ICD-10-CM | POA: Diagnosis not present

## 2020-08-24 DIAGNOSIS — M25611 Stiffness of right shoulder, not elsewhere classified: Secondary | ICD-10-CM | POA: Diagnosis not present

## 2020-08-24 DIAGNOSIS — M531 Cervicobrachial syndrome: Secondary | ICD-10-CM | POA: Diagnosis not present

## 2020-08-24 DIAGNOSIS — M23331 Other meniscus derangements, other medial meniscus, right knee: Secondary | ICD-10-CM | POA: Diagnosis not present

## 2020-08-31 DIAGNOSIS — M25611 Stiffness of right shoulder, not elsewhere classified: Secondary | ICD-10-CM | POA: Diagnosis not present

## 2020-08-31 DIAGNOSIS — M531 Cervicobrachial syndrome: Secondary | ICD-10-CM | POA: Diagnosis not present

## 2020-08-31 DIAGNOSIS — M23331 Other meniscus derangements, other medial meniscus, right knee: Secondary | ICD-10-CM | POA: Diagnosis not present

## 2020-09-07 DIAGNOSIS — M23331 Other meniscus derangements, other medial meniscus, right knee: Secondary | ICD-10-CM | POA: Diagnosis not present

## 2020-09-07 DIAGNOSIS — M531 Cervicobrachial syndrome: Secondary | ICD-10-CM | POA: Diagnosis not present

## 2020-09-07 DIAGNOSIS — M25611 Stiffness of right shoulder, not elsewhere classified: Secondary | ICD-10-CM | POA: Diagnosis not present

## 2020-09-11 DIAGNOSIS — Z01818 Encounter for other preprocedural examination: Secondary | ICD-10-CM | POA: Diagnosis not present

## 2020-09-12 DIAGNOSIS — M531 Cervicobrachial syndrome: Secondary | ICD-10-CM | POA: Diagnosis not present

## 2020-09-12 DIAGNOSIS — M25611 Stiffness of right shoulder, not elsewhere classified: Secondary | ICD-10-CM | POA: Diagnosis not present

## 2020-09-12 DIAGNOSIS — M23331 Other meniscus derangements, other medial meniscus, right knee: Secondary | ICD-10-CM | POA: Diagnosis not present

## 2020-09-14 DIAGNOSIS — M25611 Stiffness of right shoulder, not elsewhere classified: Secondary | ICD-10-CM | POA: Diagnosis not present

## 2020-09-14 DIAGNOSIS — M23331 Other meniscus derangements, other medial meniscus, right knee: Secondary | ICD-10-CM | POA: Diagnosis not present

## 2020-09-14 DIAGNOSIS — M531 Cervicobrachial syndrome: Secondary | ICD-10-CM | POA: Diagnosis not present

## 2020-09-19 DIAGNOSIS — M531 Cervicobrachial syndrome: Secondary | ICD-10-CM | POA: Diagnosis not present

## 2020-09-19 DIAGNOSIS — M25611 Stiffness of right shoulder, not elsewhere classified: Secondary | ICD-10-CM | POA: Diagnosis not present

## 2020-09-19 DIAGNOSIS — M23331 Other meniscus derangements, other medial meniscus, right knee: Secondary | ICD-10-CM | POA: Diagnosis not present

## 2020-09-26 DIAGNOSIS — M25611 Stiffness of right shoulder, not elsewhere classified: Secondary | ICD-10-CM | POA: Diagnosis not present

## 2020-09-26 DIAGNOSIS — M23331 Other meniscus derangements, other medial meniscus, right knee: Secondary | ICD-10-CM | POA: Diagnosis not present

## 2020-09-26 DIAGNOSIS — M531 Cervicobrachial syndrome: Secondary | ICD-10-CM | POA: Diagnosis not present

## 2020-09-28 DIAGNOSIS — M531 Cervicobrachial syndrome: Secondary | ICD-10-CM | POA: Diagnosis not present

## 2020-09-28 DIAGNOSIS — M25611 Stiffness of right shoulder, not elsewhere classified: Secondary | ICD-10-CM | POA: Diagnosis not present

## 2020-09-28 DIAGNOSIS — M23331 Other meniscus derangements, other medial meniscus, right knee: Secondary | ICD-10-CM | POA: Diagnosis not present

## 2020-10-02 HISTORY — PX: TOTAL KNEE ARTHROPLASTY: SHX125

## 2020-10-03 DIAGNOSIS — M25611 Stiffness of right shoulder, not elsewhere classified: Secondary | ICD-10-CM | POA: Diagnosis not present

## 2020-10-03 DIAGNOSIS — M531 Cervicobrachial syndrome: Secondary | ICD-10-CM | POA: Diagnosis not present

## 2020-10-03 DIAGNOSIS — M23331 Other meniscus derangements, other medial meniscus, right knee: Secondary | ICD-10-CM | POA: Diagnosis not present

## 2020-10-05 DIAGNOSIS — M25611 Stiffness of right shoulder, not elsewhere classified: Secondary | ICD-10-CM | POA: Diagnosis not present

## 2020-10-05 DIAGNOSIS — M531 Cervicobrachial syndrome: Secondary | ICD-10-CM | POA: Diagnosis not present

## 2020-10-05 DIAGNOSIS — M23331 Other meniscus derangements, other medial meniscus, right knee: Secondary | ICD-10-CM | POA: Diagnosis not present

## 2020-10-10 DIAGNOSIS — G8918 Other acute postprocedural pain: Secondary | ICD-10-CM | POA: Diagnosis not present

## 2020-10-10 DIAGNOSIS — M1711 Unilateral primary osteoarthritis, right knee: Secondary | ICD-10-CM | POA: Diagnosis not present

## 2020-10-16 DIAGNOSIS — M23331 Other meniscus derangements, other medial meniscus, right knee: Secondary | ICD-10-CM | POA: Diagnosis not present

## 2020-10-16 DIAGNOSIS — M25611 Stiffness of right shoulder, not elsewhere classified: Secondary | ICD-10-CM | POA: Diagnosis not present

## 2020-10-16 DIAGNOSIS — M531 Cervicobrachial syndrome: Secondary | ICD-10-CM | POA: Diagnosis not present

## 2020-10-16 DIAGNOSIS — Z471 Aftercare following joint replacement surgery: Secondary | ICD-10-CM | POA: Diagnosis not present

## 2020-10-20 DIAGNOSIS — Z471 Aftercare following joint replacement surgery: Secondary | ICD-10-CM | POA: Diagnosis not present

## 2020-10-24 DIAGNOSIS — C61 Malignant neoplasm of prostate: Secondary | ICD-10-CM | POA: Diagnosis not present

## 2020-10-25 DIAGNOSIS — Z471 Aftercare following joint replacement surgery: Secondary | ICD-10-CM | POA: Diagnosis not present

## 2020-10-31 DIAGNOSIS — Z471 Aftercare following joint replacement surgery: Secondary | ICD-10-CM | POA: Diagnosis not present

## 2020-11-06 DIAGNOSIS — Z471 Aftercare following joint replacement surgery: Secondary | ICD-10-CM | POA: Diagnosis not present

## 2020-11-08 DIAGNOSIS — Z471 Aftercare following joint replacement surgery: Secondary | ICD-10-CM | POA: Diagnosis not present

## 2020-11-14 DIAGNOSIS — Z96651 Presence of right artificial knee joint: Secondary | ICD-10-CM | POA: Diagnosis not present

## 2020-11-15 DIAGNOSIS — Z471 Aftercare following joint replacement surgery: Secondary | ICD-10-CM | POA: Diagnosis not present

## 2020-11-17 ENCOUNTER — Encounter: Payer: Self-pay | Admitting: Gastroenterology

## 2020-11-17 DIAGNOSIS — F902 Attention-deficit hyperactivity disorder, combined type: Secondary | ICD-10-CM | POA: Diagnosis not present

## 2020-11-17 DIAGNOSIS — E7800 Pure hypercholesterolemia, unspecified: Secondary | ICD-10-CM | POA: Diagnosis not present

## 2020-11-17 DIAGNOSIS — C61 Malignant neoplasm of prostate: Secondary | ICD-10-CM | POA: Diagnosis not present

## 2020-11-17 DIAGNOSIS — Z23 Encounter for immunization: Secondary | ICD-10-CM | POA: Diagnosis not present

## 2020-11-17 DIAGNOSIS — R030 Elevated blood-pressure reading, without diagnosis of hypertension: Secondary | ICD-10-CM | POA: Diagnosis not present

## 2020-11-17 DIAGNOSIS — Z0001 Encounter for general adult medical examination with abnormal findings: Secondary | ICD-10-CM | POA: Diagnosis not present

## 2020-11-17 DIAGNOSIS — K219 Gastro-esophageal reflux disease without esophagitis: Secondary | ICD-10-CM | POA: Diagnosis not present

## 2020-11-17 DIAGNOSIS — M7989 Other specified soft tissue disorders: Secondary | ICD-10-CM | POA: Diagnosis not present

## 2020-11-17 DIAGNOSIS — R7303 Prediabetes: Secondary | ICD-10-CM | POA: Diagnosis not present

## 2020-11-17 DIAGNOSIS — Z0000 Encounter for general adult medical examination without abnormal findings: Secondary | ICD-10-CM | POA: Diagnosis not present

## 2020-11-17 DIAGNOSIS — Z1159 Encounter for screening for other viral diseases: Secondary | ICD-10-CM | POA: Diagnosis not present

## 2020-11-17 DIAGNOSIS — Z79899 Other long term (current) drug therapy: Secondary | ICD-10-CM | POA: Diagnosis not present

## 2020-11-17 DIAGNOSIS — K635 Polyp of colon: Secondary | ICD-10-CM | POA: Diagnosis not present

## 2020-11-17 DIAGNOSIS — R52 Pain, unspecified: Secondary | ICD-10-CM | POA: Diagnosis not present

## 2020-11-17 DIAGNOSIS — G2581 Restless legs syndrome: Secondary | ICD-10-CM | POA: Diagnosis not present

## 2020-11-17 DIAGNOSIS — Z471 Aftercare following joint replacement surgery: Secondary | ICD-10-CM | POA: Diagnosis not present

## 2020-11-20 DIAGNOSIS — Z471 Aftercare following joint replacement surgery: Secondary | ICD-10-CM | POA: Diagnosis not present

## 2020-11-22 DIAGNOSIS — Z471 Aftercare following joint replacement surgery: Secondary | ICD-10-CM | POA: Diagnosis not present

## 2020-11-28 DIAGNOSIS — Z471 Aftercare following joint replacement surgery: Secondary | ICD-10-CM | POA: Diagnosis not present

## 2020-11-30 DIAGNOSIS — Z471 Aftercare following joint replacement surgery: Secondary | ICD-10-CM | POA: Diagnosis not present

## 2020-12-05 DIAGNOSIS — Z471 Aftercare following joint replacement surgery: Secondary | ICD-10-CM | POA: Diagnosis not present

## 2020-12-07 DIAGNOSIS — Z471 Aftercare following joint replacement surgery: Secondary | ICD-10-CM | POA: Diagnosis not present

## 2020-12-12 ENCOUNTER — Encounter: Payer: Self-pay | Admitting: Gastroenterology

## 2020-12-12 ENCOUNTER — Other Ambulatory Visit: Payer: Self-pay

## 2020-12-12 ENCOUNTER — Ambulatory Visit (AMBULATORY_SURGERY_CENTER): Payer: Self-pay

## 2020-12-12 ENCOUNTER — Telehealth: Payer: Self-pay | Admitting: Gastroenterology

## 2020-12-12 VITALS — Ht 68.0 in | Wt 200.0 lb

## 2020-12-12 DIAGNOSIS — Z86010 Personal history of colonic polyps: Secondary | ICD-10-CM

## 2020-12-12 DIAGNOSIS — Z471 Aftercare following joint replacement surgery: Secondary | ICD-10-CM | POA: Diagnosis not present

## 2020-12-12 MED ORDER — SUTAB 1479-225-188 MG PO TABS: 1.0000 | ORAL_TABLET | ORAL | 0 refills | Status: DC

## 2020-12-12 NOTE — Telephone Encounter (Signed)
Pharmacy called pt and told him the coupon was no good-  asked pt to physically take the Coupon to his pharmacy and have them run it with the codes as cash or secondary insurance and it should drop the price to between 40-50 $-  Instructed pt to take this coupon as soon as he can and if still an issue call us back - pt verbalized understanding

## 2020-12-12 NOTE — Progress Notes (Signed)
No egg or soy allergy known to patient  No issues with past sedation with any surgeries or procedures-- patient reports "I had no energy after my procedure"; No intubation problems in the past  No FH of Malignant Hyperthermia No diet pills per patient No home 02 use per patient  No blood thinners per patient --- patient is adamant he no longer takes the Xarelto Pt denies issues with constipation  No A fib or A flutter  COVID 19 guidelines implemented in [facility] today with Pt and RN  Pt is fully vaccinated  for Covid x2;  Coupon given to pt in [facility] today , Code to Pharmacy  Due to the COVID-19 pandemic we are asking patients to follow certain guidelines.  Pt aware of COVID protocols and [facility] guidelines

## 2020-12-14 DIAGNOSIS — Z471 Aftercare following joint replacement surgery: Secondary | ICD-10-CM | POA: Diagnosis not present

## 2020-12-19 DIAGNOSIS — Z471 Aftercare following joint replacement surgery: Secondary | ICD-10-CM | POA: Diagnosis not present

## 2020-12-26 ENCOUNTER — Encounter: Payer: Self-pay | Admitting: Gastroenterology

## 2020-12-26 ENCOUNTER — Other Ambulatory Visit: Payer: Self-pay

## 2020-12-26 ENCOUNTER — Ambulatory Visit (AMBULATORY_SURGERY_CENTER): Payer: PPO | Admitting: Gastroenterology

## 2020-12-26 VITALS — BP 124/86 | HR 79 | Temp 97.3°F | Resp 18 | Ht 68.0 in | Wt 200.0 lb

## 2020-12-26 DIAGNOSIS — Z86010 Personal history of colonic polyps: Secondary | ICD-10-CM | POA: Diagnosis not present

## 2020-12-26 DIAGNOSIS — D123 Benign neoplasm of transverse colon: Secondary | ICD-10-CM | POA: Diagnosis not present

## 2020-12-26 DIAGNOSIS — D128 Benign neoplasm of rectum: Secondary | ICD-10-CM | POA: Diagnosis not present

## 2020-12-26 DIAGNOSIS — K219 Gastro-esophageal reflux disease without esophagitis: Secondary | ICD-10-CM | POA: Diagnosis not present

## 2020-12-26 MED ORDER — SODIUM CHLORIDE 0.9 % IV SOLN: 500.0000 mL | Freq: Once | INTRAVENOUS | Status: DC

## 2020-12-26 NOTE — Op Note (Signed)
[facility] Patient Name: [name] Procedure Date: 12/26/2020 1:30 PM MRN: [id number] Endoscopist: Gabriel Mansouraty , MD Age: 64 Referring MD:  Date of Birth: 10/19/1956 Gender: Male Account #: [id number] Procedure:                Colonoscopy Indications:              Surveillance: History of numerous (> 10) adenomas                            on last colonoscopy (1 year ago) Medicines:                Monitored Anesthesia Care Procedure:                Pre-Anesthesia Assessment:                           - Prior to the procedure, a History and Physical                            was performed, and patient medications and                            allergies were reviewed. The patient's tolerance of                            previous anesthesia was also reviewed. The risks                            and benefits of the procedure and the sedation                            options and risks were discussed with the patient.                            All questions were answered, and informed consent                            was obtained. Prior Anticoagulants: The patient has                            taken no previous anticoagulant or antiplatelet                            agents except for aspirin. ASA Grade Assessment: II                            - A patient with mild systemic disease. After                            reviewing the risks and benefits, the patient was                            deemed in satisfactory condition to undergo the                              procedure.                           After obtaining informed consent, the colonoscope                            was passed under direct vision. Throughout the                            procedure, the patient's blood pressure, pulse, and                            oxygen saturations were monitored continuously. The                            Olympus CF-[id number] (Serial# [id number]) Colonoscope was                             introduced through the anus and advanced to the 5                            cm into the ileum. The colonoscopy was performed                            without difficulty. The patient tolerated the                            procedure. The quality of the bowel preparation was                            adequate. The terminal ileum, ileocecal valve,                            appendiceal orifice, and rectum were photographed. Scope In: 1:35:53 PM Scope Out: 1:57:49 PM Scope Withdrawal Time: 0 hours 16 minutes 56 seconds  Total Procedure Duration: 0 hours 21 minutes 56 seconds  Findings:                 Skin tags were found on perianal exam.                           The digital rectal exam findings include                            hemorrhoids. Pertinent negatives include no                            palpable rectal lesions.                           The terminal ileum and ileocecal valve appeared                            normal.  Three sessile polyps were found in the rectum and                            hepatic flexure. The polyps were 2 to 4 mm in size.                            These polyps were removed with a cold snare.                            Resection and retrieval were complete.                           Normal mucosa was found in the entire colon                            otherwise.                           Non-bleeding non-thrombosed external and internal                            hemorrhoids were found during retroflexion, during                            perianal exam and during digital exam. The                            hemorrhoids were Grade II (internal hemorrhoids                            that prolapse but reduce spontaneously). Complications:            No immediate complications. Estimated Blood Loss:     Estimated blood loss was minimal. Impression:               - Perianal skin tags found on perianal  exam.                           - Hemorrhoids found on digital rectal exam.                           - The examined portion of the ileum was normal.                           - Three 2 to 4 mm polyps in the rectum and at the                            hepatic flexure, removed with a cold snare.                            Resected and retrieved.                           - Normal mucosa in the entire examined colon  otherwise.                           - Non-bleeding non-thrombosed external and internal                            hemorrhoids. Recommendation:           - The patient will be observed post-procedure,                            until all discharge criteria are met.                           - Discharge patient to home.                           - Patient has a contact number available for                            emergencies. The signs and symptoms of potential                            delayed complications were discussed with the                            patient. Return to normal activities tomorrow.                            Written discharge instructions were provided to the                            patient.                           - High fiber diet.                           - Use FiberCon 1-2 tablets PO daily.                           - Continue present medications.                           - Await pathology results.                           - Repeat colonoscopy in 3 years for surveillance.                           - The findings and recommendations were discussed                            with the patient.                           - The findings and recommendations were discussed  with the patient's family. Justice Britain, MD 12/26/2020 2:08:18 PM

## 2020-12-26 NOTE — Progress Notes (Signed)
C.W. vital signs. 

## 2020-12-26 NOTE — Progress Notes (Signed)
Report given to PACU, vss 

## 2020-12-26 NOTE — Progress Notes (Signed)
Called to room to assist during endoscopic procedure.  Patient ID and intended procedure confirmed with present staff. Received instructions for my participation in the procedure from the performing physician.  

## 2020-12-26 NOTE — Progress Notes (Signed)
Pt's states no medical or surgical changes since previsit or office visit. 

## 2020-12-26 NOTE — Patient Instructions (Signed)
Handout on polyps given.  High fiber diet. Use FiberCon 1-2 tablets daily.    YOU HAD AN ENDOSCOPIC PROCEDURE TODAY AT [facility]:   Refer to the procedure report that was given to you for any specific questions about what was found during the examination.  If the procedure report does not answer your questions, please call your gastroenterologist to clarify.  If you requested that your care partner not be given the details of your procedure findings, then the procedure report has been included in a sealed envelope for you to review at your convenience later.  YOU SHOULD EXPECT: Some feelings of bloating in the abdomen. Passage of more gas than usual.  Walking can help get rid of the air that was put into your GI tract during the procedure and reduce the bloating. If you had a lower endoscopy (such as a colonoscopy or flexible sigmoidoscopy) you may notice spotting of blood in your stool or on the toilet paper. If you underwent a bowel prep for your procedure, you may not have a normal bowel movement for a few days.  Please Note:  You might notice some irritation and congestion in your nose or some drainage.  This is from the oxygen used during your procedure.  There is no need for concern and it should clear up in a day or so.  SYMPTOMS TO REPORT IMMEDIATELY:   Following lower endoscopy (colonoscopy or flexible sigmoidoscopy):  Excessive amounts of blood in the stool  Significant tenderness or worsening of abdominal pains  Swelling of the abdomen that is new, acute  Fever of 100F or higher   For urgent or emergent issues, a gastroenterologist can be reached at any hour by calling [phone]. Do not use MyChart messaging for urgent concerns.    DIET:  We do recommend a small meal at first, but then you may proceed to your regular diet.  Drink plenty of fluids but you should avoid alcoholic beverages for 24 hours.  ACTIVITY:  You should plan to take it easy for the  rest of today and you should NOT DRIVE or use heavy machinery until tomorrow (because of the sedation medicines used during the test).    FOLLOW UP: Our staff will call the number listed on your records 48-72 hours following your procedure to check on you and address any questions or concerns that you may have regarding the information given to you following your procedure. If we do not reach you, we will leave a message.  We will attempt to reach you two times.  During this call, we will ask if you have developed any symptoms of COVID 19. If you develop any symptoms (ie: fever, flu-like symptoms, shortness of breath, cough etc.) before then, please call [phone].  If you test positive for Covid 19 in the 2 weeks post procedure, please call and report this information to [place].    If any biopsies were taken you will be contacted by phone or by letter within the next 1-3 weeks.  Please call us at [phone] if you have not heard about the biopsies in 3 weeks.    SIGNATURES/CONFIDENTIALITY: You and/or your care partner have signed paperwork which will be entered into your electronic medical record.  These signatures attest to the fact that that the information above on your After Visit Summary has been reviewed and is understood.  Full responsibility of the confidentiality of this discharge information lies with you and/or your care-partner.

## 2020-12-28 ENCOUNTER — Telehealth (HOSPITAL_COMMUNITY): Payer: Self-pay

## 2020-12-28 ENCOUNTER — Telehealth: Payer: Self-pay

## 2020-12-28 NOTE — Telephone Encounter (Signed)
  Follow up Call-  Call back number 12/26/2020 11/30/2019  Post procedure Call Back phone  # [phone]  Permission to leave phone message Yes Yes  Some recent data might be hidden     Patient questions:  Do you have a fever, pain , or abdominal swelling? No. Pain Score  0 *  Have you tolerated food without any problems? Yes.    Have you been able to return to your normal activities? Yes.    Do you have any questions about your discharge instructions: Diet   No. Medications  No. Follow up visit  No.  Do you have questions or concerns about your Care? No.  Actions: * If pain score is 4 or above: No action needed, pain <4.  1. Have you developed a fever since your procedure? no  2.   Have you had an respiratory symptoms (SOB or cough) since your procedure? no  3.   Have you tested positive for COVID 19 since your procedure no  4.   Have you had any family members/close contacts diagnosed with the COVID 19 since your procedure?  no   If yes to any of these questions please route to Joylene John, RN and Joella Prince, RN

## 2020-12-28 NOTE — Telephone Encounter (Signed)
First attempt follow up call to pt, lm on vm 

## 2021-01-03 ENCOUNTER — Encounter: Payer: Self-pay | Admitting: Gastroenterology

## 2021-01-09 DIAGNOSIS — Z471 Aftercare following joint replacement surgery: Secondary | ICD-10-CM | POA: Diagnosis not present

## 2021-01-11 DIAGNOSIS — Z471 Aftercare following joint replacement surgery: Secondary | ICD-10-CM | POA: Diagnosis not present

## 2021-01-12 DIAGNOSIS — L821 Other seborrheic keratosis: Secondary | ICD-10-CM | POA: Diagnosis not present

## 2021-01-12 DIAGNOSIS — Z85828 Personal history of other malignant neoplasm of skin: Secondary | ICD-10-CM | POA: Diagnosis not present

## 2021-01-12 DIAGNOSIS — L820 Inflamed seborrheic keratosis: Secondary | ICD-10-CM | POA: Diagnosis not present

## 2021-01-12 DIAGNOSIS — D2372 Other benign neoplasm of skin of left lower limb, including hip: Secondary | ICD-10-CM | POA: Diagnosis not present

## 2021-01-12 DIAGNOSIS — L853 Xerosis cutis: Secondary | ICD-10-CM | POA: Diagnosis not present

## 2021-01-12 DIAGNOSIS — D485 Neoplasm of uncertain behavior of skin: Secondary | ICD-10-CM | POA: Diagnosis not present

## 2021-01-12 DIAGNOSIS — D225 Melanocytic nevi of trunk: Secondary | ICD-10-CM | POA: Diagnosis not present

## 2021-01-12 DIAGNOSIS — C4441 Basal cell carcinoma of skin of scalp and neck: Secondary | ICD-10-CM | POA: Diagnosis not present

## 2021-01-12 DIAGNOSIS — L570 Actinic keratosis: Secondary | ICD-10-CM | POA: Diagnosis not present

## 2021-01-16 DIAGNOSIS — Z471 Aftercare following joint replacement surgery: Secondary | ICD-10-CM | POA: Diagnosis not present

## 2021-01-18 DIAGNOSIS — Z471 Aftercare following joint replacement surgery: Secondary | ICD-10-CM | POA: Diagnosis not present

## 2021-01-23 DIAGNOSIS — Z471 Aftercare following joint replacement surgery: Secondary | ICD-10-CM | POA: Diagnosis not present

## 2021-01-25 DIAGNOSIS — Z85828 Personal history of other malignant neoplasm of skin: Secondary | ICD-10-CM | POA: Diagnosis not present

## 2021-01-25 DIAGNOSIS — C4441 Basal cell carcinoma of skin of scalp and neck: Secondary | ICD-10-CM | POA: Diagnosis not present

## 2021-01-25 DIAGNOSIS — Z471 Aftercare following joint replacement surgery: Secondary | ICD-10-CM | POA: Diagnosis not present

## 2021-01-30 DIAGNOSIS — Z471 Aftercare following joint replacement surgery: Secondary | ICD-10-CM | POA: Diagnosis not present

## 2021-02-01 DIAGNOSIS — Z471 Aftercare following joint replacement surgery: Secondary | ICD-10-CM | POA: Diagnosis not present

## 2021-02-06 DIAGNOSIS — Z471 Aftercare following joint replacement surgery: Secondary | ICD-10-CM | POA: Diagnosis not present

## 2021-02-08 DIAGNOSIS — Z471 Aftercare following joint replacement surgery: Secondary | ICD-10-CM | POA: Diagnosis not present

## 2021-02-12 DIAGNOSIS — Z471 Aftercare following joint replacement surgery: Secondary | ICD-10-CM | POA: Diagnosis not present

## 2021-02-14 DIAGNOSIS — Z471 Aftercare following joint replacement surgery: Secondary | ICD-10-CM | POA: Diagnosis not present

## 2021-03-15 DIAGNOSIS — M5459 Other low back pain: Secondary | ICD-10-CM | POA: Diagnosis not present

## 2021-03-22 DIAGNOSIS — M5459 Other low back pain: Secondary | ICD-10-CM | POA: Diagnosis not present

## 2021-03-28 DIAGNOSIS — M5459 Other low back pain: Secondary | ICD-10-CM | POA: Diagnosis not present

## 2021-04-05 DIAGNOSIS — M5459 Other low back pain: Secondary | ICD-10-CM | POA: Diagnosis not present

## 2021-04-12 DIAGNOSIS — M5459 Other low back pain: Secondary | ICD-10-CM | POA: Diagnosis not present

## 2021-04-17 DIAGNOSIS — M5459 Other low back pain: Secondary | ICD-10-CM | POA: Diagnosis not present

## 2021-04-26 DIAGNOSIS — M5459 Other low back pain: Secondary | ICD-10-CM | POA: Diagnosis not present

## 2021-05-04 DIAGNOSIS — M5459 Other low back pain: Secondary | ICD-10-CM | POA: Diagnosis not present

## 2021-05-10 DIAGNOSIS — M5459 Other low back pain: Secondary | ICD-10-CM | POA: Diagnosis not present

## 2021-05-16 DIAGNOSIS — M5459 Other low back pain: Secondary | ICD-10-CM | POA: Diagnosis not present

## 2021-05-18 DIAGNOSIS — C61 Malignant neoplasm of prostate: Secondary | ICD-10-CM | POA: Diagnosis not present

## 2021-05-18 DIAGNOSIS — G4709 Other insomnia: Secondary | ICD-10-CM | POA: Diagnosis not present

## 2021-05-18 DIAGNOSIS — R972 Elevated prostate specific antigen [PSA]: Secondary | ICD-10-CM | POA: Diagnosis not present

## 2021-05-18 DIAGNOSIS — E7800 Pure hypercholesterolemia, unspecified: Secondary | ICD-10-CM | POA: Diagnosis not present

## 2021-05-18 DIAGNOSIS — F902 Attention-deficit hyperactivity disorder, combined type: Secondary | ICD-10-CM | POA: Diagnosis not present

## 2021-05-18 DIAGNOSIS — G2581 Restless legs syndrome: Secondary | ICD-10-CM | POA: Diagnosis not present

## 2021-05-18 DIAGNOSIS — Z23 Encounter for immunization: Secondary | ICD-10-CM | POA: Diagnosis not present

## 2021-05-18 DIAGNOSIS — R7303 Prediabetes: Secondary | ICD-10-CM | POA: Diagnosis not present

## 2021-05-18 DIAGNOSIS — D649 Anemia, unspecified: Secondary | ICD-10-CM | POA: Diagnosis not present

## 2021-05-18 DIAGNOSIS — K219 Gastro-esophageal reflux disease without esophagitis: Secondary | ICD-10-CM | POA: Diagnosis not present

## 2021-05-22 DIAGNOSIS — M545 Low back pain, unspecified: Secondary | ICD-10-CM | POA: Diagnosis not present

## 2021-06-05 DIAGNOSIS — M545 Low back pain, unspecified: Secondary | ICD-10-CM | POA: Diagnosis not present

## 2021-06-07 DIAGNOSIS — M545 Low back pain, unspecified: Secondary | ICD-10-CM | POA: Diagnosis not present

## 2021-06-12 DIAGNOSIS — M545 Low back pain, unspecified: Secondary | ICD-10-CM | POA: Diagnosis not present

## 2021-06-14 DIAGNOSIS — M545 Low back pain, unspecified: Secondary | ICD-10-CM | POA: Diagnosis not present

## 2021-06-26 DIAGNOSIS — M545 Low back pain, unspecified: Secondary | ICD-10-CM | POA: Diagnosis not present

## 2021-07-10 DIAGNOSIS — M545 Low back pain, unspecified: Secondary | ICD-10-CM | POA: Diagnosis not present

## 2021-07-12 DIAGNOSIS — M545 Low back pain, unspecified: Secondary | ICD-10-CM | POA: Diagnosis not present

## 2021-07-17 DIAGNOSIS — M545 Low back pain, unspecified: Secondary | ICD-10-CM | POA: Diagnosis not present

## 2021-07-20 DIAGNOSIS — M545 Low back pain, unspecified: Secondary | ICD-10-CM | POA: Diagnosis not present

## 2021-07-23 DIAGNOSIS — M545 Low back pain, unspecified: Secondary | ICD-10-CM | POA: Diagnosis not present

## 2021-08-27 DIAGNOSIS — C61 Malignant neoplasm of prostate: Secondary | ICD-10-CM | POA: Diagnosis not present

## 2021-09-12 DIAGNOSIS — M545 Low back pain, unspecified: Secondary | ICD-10-CM | POA: Diagnosis not present

## 2021-10-04 DIAGNOSIS — M545 Low back pain, unspecified: Secondary | ICD-10-CM | POA: Diagnosis not present

## 2021-10-09 DIAGNOSIS — M545 Low back pain, unspecified: Secondary | ICD-10-CM | POA: Diagnosis not present

## 2021-10-12 DIAGNOSIS — Z96651 Presence of right artificial knee joint: Secondary | ICD-10-CM | POA: Diagnosis not present

## 2021-10-12 DIAGNOSIS — M545 Low back pain, unspecified: Secondary | ICD-10-CM | POA: Diagnosis not present

## 2021-10-16 DIAGNOSIS — M545 Low back pain, unspecified: Secondary | ICD-10-CM | POA: Diagnosis not present

## 2021-10-23 DIAGNOSIS — M545 Low back pain, unspecified: Secondary | ICD-10-CM | POA: Diagnosis not present

## 2021-11-02 DIAGNOSIS — M545 Low back pain, unspecified: Secondary | ICD-10-CM | POA: Diagnosis not present

## 2021-11-14 DIAGNOSIS — D2371 Other benign neoplasm of skin of right lower limb, including hip: Secondary | ICD-10-CM | POA: Diagnosis not present

## 2021-11-14 DIAGNOSIS — L820 Inflamed seborrheic keratosis: Secondary | ICD-10-CM | POA: Diagnosis not present

## 2021-11-20 DIAGNOSIS — G4709 Other insomnia: Secondary | ICD-10-CM | POA: Diagnosis not present

## 2021-11-20 DIAGNOSIS — C61 Malignant neoplasm of prostate: Secondary | ICD-10-CM | POA: Diagnosis not present

## 2021-11-20 DIAGNOSIS — R7303 Prediabetes: Secondary | ICD-10-CM | POA: Diagnosis not present

## 2021-11-20 DIAGNOSIS — Z0000 Encounter for general adult medical examination without abnormal findings: Secondary | ICD-10-CM | POA: Diagnosis not present

## 2021-11-20 DIAGNOSIS — G2581 Restless legs syndrome: Secondary | ICD-10-CM | POA: Diagnosis not present

## 2021-11-20 DIAGNOSIS — E7800 Pure hypercholesterolemia, unspecified: Secondary | ICD-10-CM | POA: Diagnosis not present

## 2021-11-20 DIAGNOSIS — F902 Attention-deficit hyperactivity disorder, combined type: Secondary | ICD-10-CM | POA: Diagnosis not present

## 2021-11-20 DIAGNOSIS — K219 Gastro-esophageal reflux disease without esophagitis: Secondary | ICD-10-CM | POA: Diagnosis not present

## 2022-08-03 ENCOUNTER — Emergency Department (HOSPITAL_BASED_OUTPATIENT_CLINIC_OR_DEPARTMENT_OTHER)
Admission: EM | Admit: 2022-08-03 | Discharge: 2022-08-03 | Disposition: A | Discharge status: Home / Self Care | Payer: PPO | Attending: Emergency Medicine | Admitting: Emergency Medicine

## 2022-08-03 ENCOUNTER — Emergency Department (HOSPITAL_BASED_OUTPATIENT_CLINIC_OR_DEPARTMENT_OTHER): Payer: PPO | Admitting: Radiology

## 2022-08-03 ENCOUNTER — Encounter (HOSPITAL_BASED_OUTPATIENT_CLINIC_OR_DEPARTMENT_OTHER): Payer: Self-pay | Admitting: Emergency Medicine

## 2022-08-03 ENCOUNTER — Emergency Department (HOSPITAL_BASED_OUTPATIENT_CLINIC_OR_DEPARTMENT_OTHER): Payer: PPO

## 2022-08-03 ENCOUNTER — Other Ambulatory Visit: Payer: Self-pay

## 2022-08-03 DIAGNOSIS — Z8546 Personal history of malignant neoplasm of prostate: Secondary | ICD-10-CM | POA: Insufficient documentation

## 2022-08-03 DIAGNOSIS — K8020 Calculus of gallbladder without cholecystitis without obstruction: Secondary | ICD-10-CM | POA: Diagnosis not present

## 2022-08-03 DIAGNOSIS — M545 Low back pain, unspecified: Secondary | ICD-10-CM | POA: Diagnosis present

## 2022-08-03 DIAGNOSIS — Z7982 Long term (current) use of aspirin: Secondary | ICD-10-CM | POA: Insufficient documentation

## 2022-08-03 LAB — CBC WITH DIFFERENTIAL/PLATELET
Abs Immature Granulocytes: 0.06 10*3/uL (ref 0.00–0.07)
Basophils Absolute: 0.1 10*3/uL (ref 0.0–0.1)
Basophils Relative: 1 %
Eosinophils Absolute: 0.3 10*3/uL (ref 0.0–0.5)
Eosinophils Relative: 2 %
HCT: 45.6 % (ref 39.0–52.0)
Hemoglobin: 15.3 g/dL (ref 13.0–17.0)
Immature Granulocytes: 1 %
Lymphocytes Relative: 23 %
Lymphs Abs: 2.6 10*3/uL (ref 0.7–4.0)
MCH: 29 pg (ref 26.0–34.0)
MCHC: 33.6 g/dL (ref 30.0–36.0)
MCV: 86.5 fL (ref 80.0–100.0)
Monocytes Absolute: 0.8 10*3/uL (ref 0.1–1.0)
Monocytes Relative: 7 %
Neutro Abs: 7.4 10*3/uL (ref 1.7–7.7)
Neutrophils Relative %: 66 %
Platelets: 200 10*3/uL (ref 150–400)
RBC: 5.27 MIL/uL (ref 4.22–5.81)
RDW: 13.3 % (ref 11.5–15.5)
WBC: 11.3 10*3/uL — ABNORMAL HIGH (ref 4.0–10.5)
nRBC: 0 % (ref 0.0–0.2)

## 2022-08-03 LAB — URINALYSIS, ROUTINE W REFLEX MICROSCOPIC
Bilirubin Urine: NEGATIVE
Glucose, UA: NEGATIVE mg/dL
Ketones, ur: NEGATIVE mg/dL
Leukocytes,Ua: NEGATIVE
Nitrite: NEGATIVE
Protein, ur: NEGATIVE mg/dL
Specific Gravity, Urine: <1.005 — ABNORMAL LOW (ref 1.005–1.030)
pH: 5.5 (ref 5.0–8.0)

## 2022-08-03 LAB — BASIC METABOLIC PANEL
Anion gap: 8 (ref 5–15)
BUN: 14 mg/dL (ref 8–23)
CO2: 29 mmol/L (ref 22–32)
Calcium: 9.6 mg/dL (ref 8.9–10.3)
Chloride: 101 mmol/L (ref 98–111)
Creatinine, Ser: 0.99 mg/dL (ref 0.61–1.24)
GFR, Estimated: >60 mL/min (ref >60)
Glucose, Bld: 107 mg/dL — ABNORMAL HIGH (ref 70–99)
Potassium: 3.8 mmol/L (ref 3.5–5.1)
Sodium: 138 mmol/L (ref 135–145)

## 2022-08-03 LAB — HEPATIC FUNCTION PANEL
ALT: 25 U/L (ref 0–44)
AST: 23 U/L (ref 15–41)
Albumin: 4.3 g/dL (ref 3.5–5.0)
Alkaline Phosphatase: 63 U/L (ref 38–126)
Bilirubin, Direct: <0.1 mg/dL (ref 0.0–0.2)
Total Bilirubin: 0.5 mg/dL (ref 0.3–1.2)
Total Protein: 7.1 g/dL (ref 6.5–8.1)

## 2022-08-03 LAB — LIPASE, BLOOD: Lipase: 12 U/L (ref 11–51)

## 2022-08-03 MED ORDER — HYDROCODONE-ACETAMINOPHEN 5-325 MG PO TABS: 1.0000 | ORAL_TABLET | ORAL | 0 refills | Status: DC | PRN

## 2022-08-03 MED ORDER — FENTANYL CITRATE PF 50 MCG/ML IJ SOSY
50.0000 ug | PREFILLED_SYRINGE | Freq: Once | INTRAMUSCULAR | Status: AC
  Administered 2022-08-03: 50 ug via INTRAVENOUS
  Filled 2022-08-03: qty 1

## 2022-08-03 MED ORDER — KETOROLAC TROMETHAMINE 15 MG/ML IJ SOLN
30.0000 mg | Freq: Once | INTRAMUSCULAR | Status: AC
  Administered 2022-08-03: 30 mg via INTRAMUSCULAR
  Filled 2022-08-03: qty 2

## 2022-08-03 MED ORDER — MELOXICAM 7.5 MG PO TABS: 7.5000 mg | ORAL_TABLET | Freq: Every day | ORAL | 0 refills | Status: AC

## 2022-08-03 NOTE — ED Provider Notes (Signed)
[facility] EMERGENCY DEPT Provider Note   CSN: [id number] Arrival date & time: 08/03/22  1628     History  Chief Complaint  Patient presents with   Back Pain   Flank Pain    [name] is a 66 y.o. male.  Patient is a 66 year old male with a history of prostate cancer, hyperlipidemia, ADHD and restless leg syndrome who presents with back pain.  He said it started in his right mid back about a week ago.  He said it was a dull ache at first but seems to have been getting worse.  Is worse with certain positions and movements.  He denies any known injury.  There is no radiation down his legs.  No radiation around to his abdomen.  He says it stays in the same point.  He denies any pleuritic symptoms.  No leg pain or swelling.  No shortness of breath.  No associated nausea or vomiting.  No fevers.  No urinary symptoms.  No prior history of back issues.  He did try ibuprofen yesterday without improvement in symptoms.       Home Medications Prior to Admission medications   Medication Sig Start Date End Date Taking? Authorizing Provider  HYDROcodone-acetaminophen (NORCO/VICODIN) 5-325 MG tablet Take 1 tablet by mouth every 4 (four) hours as needed. 08/03/22  Yes Malvin Johns, MD  meloxicam (MOBIC) 7.5 MG tablet Take 1 tablet (7.5 mg total) by mouth daily. 08/03/22  Yes Malvin Johns, MD  amphetamine-dextroamphetamine (ADDERALL XR) 20 MG 24 hr capsule Take 1 capsule (20 mg total) by mouth every morning. 12/04/15   Harrison Mons, PA  aspirin 81 MG tablet Take 81 mg by mouth once a week.    [provider]  Cholecalciferol (VITAMIN D PO) Take daily by mouth.    [provider]  CIALIS 20 MG tablet TAKE ONE TABLET BY MOUTH ONCE DAILY AS NEEDED FOR ERECTILE DYSFUNCTION 09/11/15   Orma Flaming, MD  Cyanocobalamin (VITAMIN B-12 PO) Take daily by mouth.    [provider]  gabapentin (NEURONTIN) 300 MG capsule Take 2 capsules (600 mg total) by mouth  3 (three) times daily. Please call for yearly appt to continue refills.  May also request PCP to take over prescription. 11/04/18   Marcial Pacas, MD  linaclotide [facility]) 145 MCG CAPS capsule Linzess 145 mcg capsule    [provider]  methocarbamol (ROBAXIN) 500 MG tablet daily as needed. 11/27/20   [provider]  omeprazole (PRILOSEC) 40 MG capsule Take 40 mg daily by mouth. 08/20/17   [provider]  rosuvastatin (CRESTOR) 10 MG tablet Take by mouth daily as needed. 10/07/19   [provider]  valACYclovir (VALTREX) 1000 MG tablet Take 1 tablet by mouth twice daily for 2 days at the first sign of flare 08/06/19   [provider]  zolpidem (AMBIEN) 10 MG tablet Take 1 tablet (10 mg total) by mouth at bedtime as needed for sleep. Will need appt for add'l refills. 02/15/16   Gale Journey, Damaris Hippo, PA-C      Allergies    Patient has no known allergies.    Review of Systems   Review of Systems  Constitutional:  Negative for chills, diaphoresis, fatigue and fever.  HENT:  Negative for congestion, rhinorrhea and sneezing.   Eyes: Negative.   Respiratory:  Negative for cough, chest tightness and shortness of breath.   Cardiovascular:  Negative for chest pain and leg swelling.  Gastrointestinal:  Negative for  abdominal pain, blood in stool, diarrhea, nausea and vomiting.  Genitourinary:  Negative for difficulty urinating, flank pain, frequency and hematuria.  Musculoskeletal:  Positive for back pain. Negative for arthralgias.  Skin:  Negative for rash.  Neurological:  Negative for dizziness, speech difficulty, weakness, numbness and headaches.    Physical Exam Updated Vital Signs BP (!) 150/99   Pulse 73   Temp 98.1 F (36.7 C)   Resp 18   SpO2 99%  Physical Exam Constitutional:      Appearance: He is well-developed.  HENT:     Head: Normocephalic and atraumatic.  Eyes:     Pupils: Pupils are equal, round, and reactive to light.  Cardiovascular:      Rate and Rhythm: Normal rate and regular rhythm.     Heart sounds: Normal heart sounds.  Pulmonary:     Effort: Pulmonary effort is normal. No respiratory distress.     Breath sounds: Normal breath sounds. No wheezing or rales.  Chest:     Chest wall: No tenderness.  Abdominal:     General: Bowel sounds are normal.     Palpations: Abdomen is soft.     Tenderness: There is no abdominal tenderness. There is no guarding or rebound.  Musculoskeletal:        General: Normal range of motion.     Cervical back: Normal range of motion and neck supple.     Comments: Positive tenderness to the right mid back area.  There is no spinal tenderness.  No swelling or rashes noted.  Pedal pulses are intact in the right foot.  No leg swelling or calf pain.  Lymphadenopathy:     Cervical: No cervical adenopathy.  Skin:    General: Skin is warm and dry.     Findings: No rash.  Neurological:     General: No focal deficit present.     Mental Status: He is alert and oriented to person, place, and time.     Comments: Motor 5 out of 5 all extremities, sensation grossly intact to light touch all extremities     ED Results / Procedures / Treatments   Labs (all labs ordered are listed, but only abnormal results are displayed) Labs Reviewed  URINALYSIS, ROUTINE W REFLEX MICROSCOPIC - Abnormal; Notable for the following components:      Result Value   Specific Gravity, Urine <1.005 (*)    Hgb urine dipstick TRACE (*)    All other components within normal limits  BASIC METABOLIC PANEL - Abnormal; Notable for the following components:   Glucose, Bld 107 (*)    All other components within normal limits  CBC WITH DIFFERENTIAL/PLATELET - Abnormal; Notable for the following components:   WBC 11.3 (*)    All other components within normal limits  HEPATIC FUNCTION PANEL  LIPASE, BLOOD    EKG None  Radiology US Abdomen Limited RUQ (LIVER/GB)  Result Date: 08/03/2022 CLINICAL DATA:  Gallbladder stones  seen in previous CT EXAM: ULTRASOUND ABDOMEN LIMITED RIGHT UPPER QUADRANT COMPARISON:  CT done earlier today FINDINGS: Gallbladder: There are 2 mobile echogenic structures in the lumen of gallbladder consistent with gallstones measuring up to 8 mm in size. Gallbladder is not distended. There is no significant wall thickening. Technologist did not observe any tenderness over the gallbladder. Common bile duct: Diameter: 4 mm. Evaluation was technically limited. Distal common bile duct is not adequately visualized. Liver: There is increased echogenicity of liver suggesting fatty infiltration. No focal abnormalities are seen in visualized  portions of liver. Portal vein is patent on color Doppler imaging with normal direction of blood flow towards the liver. Other: None. IMPRESSION: Gallbladder stones. There are no signs of acute cholecystitis. Fatty liver. Electronically Signed   By: Elmer Picker M.D.   On: 08/03/2022 19:38   CT Renal Stone Study  Result Date: 08/03/2022 CLINICAL DATA:  Right flank pain for 1 week. EXAM: CT ABDOMEN AND PELVIS WITHOUT CONTRAST TECHNIQUE: Multidetector CT imaging of the abdomen and pelvis was performed following the standard protocol without IV contrast. RADIATION DOSE REDUCTION: This exam was performed according to the departmental dose-optimization program which includes automated exposure control, adjustment of the mA and/or kV according to patient size and/or use of iterative reconstruction technique. COMPARISON:  None Available. FINDINGS: Lower chest: Clear lung bases. Hepatobiliary: Mild hepatic steatosis. No evidence of focal liver lesion on this unenhanced exam. Small calcified gallstones within physiologically distended gallbladder. No pericholecystic fat stranding. No biliary dilatation. Pancreas: Fatty atrophy.  No ductal dilatation or inflammation. Spleen: Normal in size without focal abnormality. Adrenals/Urinary Tract: No adrenal nodule. No hydronephrosis. No renal  calculi. No perinephric edema. Decompressed ureters. Minimally distended urinary bladder, no bladder wall thickening or stone. Stomach/Bowel: Decompressed stomach. There is no small bowel obstruction or inflammation. Normal appendix. Small to moderate volume of stool in the colon. There is sigmoid colonic redundancy. No colonic inflammation. Vascular/Lymphatic: Aortic atherosclerosis and tortuosity. No aortic aneurysm. Prominent periportal node is likely reactive. No enlarged lymph nodes in the abdomen or pelvis. Reproductive: Enlarged prostate gland spans 5.5 cm transverse. Other: No free air or ascites. There is minimal fat in the inguinal canals. Small fat containing umbilical hernia. Musculoskeletal: There are no acute or suspicious osseous abnormalities. The included ribs are intact. Mild multilevel degenerative change in the lumbar spine with degenerative disc disease and facet hypertrophy. No muscular findings to explain pain. IMPRESSION: 1. No renal stones or obstructive uropathy. 2. Cholelithiasis without CT findings of acute cholecystitis. 3. Mild hepatic steatosis. 4. Enlarged prostate gland. Aortic Atherosclerosis (ICD10-I70.0). Electronically Signed   By: Keith Rake M.D.   On: 08/03/2022 18:32   DG Ribs Unilateral W/Chest Right  Result Date: 08/03/2022 CLINICAL DATA:  Posterior back/rib pain.  Right flank pain. EXAM: RIGHT RIBS AND CHEST - 3+ VIEW COMPARISON:  None Available. FINDINGS: No fracture or other bone lesions are seen involving the ribs. A BB overlies the lower ribs which are better assessed on concurrent abdominopelvic CT. There is no evidence of pneumothorax or pleural effusion. Both lungs are clear. Heart size and mediastinal contours are within normal limits. Right proximal humeral arthroplasty. IMPRESSION: Negative for right rib fracture or acute pulmonary process. Electronically Signed   By: Keith Rake M.D.   On: 08/03/2022 18:26    Procedures Procedures     Medications Ordered in ED Medications  ketorolac (TORADOL) 15 MG/ML injection 30 mg (30 mg Intramuscular Given 08/03/22 1746)  fentaNYL (SUBLIMAZE) injection 50 mcg (50 mcg Intravenous Given 08/03/22 1908)    ED Course/ Medical Decision Making/ A&P                           Medical Decision Making Amount and/or Complexity of Data Reviewed Labs: ordered. Radiology: ordered.  Risk Prescription drug management.   Patient is a 66 year old male who presents with some right mid back pain.  Seems to be musculoskeletal in nature and that it is worse with positions and movements.  He does not have  any pleuritic pain, shortness of breath or other symptoms that would be more concerning for PE.  Given his history of prostate cancer, rib x-rays were performed which showed no evidence of metastatic disease or underlying lung issues.  This was interpreted by me and confirmed by the radiologist.  His urinalysis showed some trace blood.  CT scan was performed.  There is no evidence of kidney stones.  However he does have gallstones.  Gallbladder ultrasound was performed which showed gallstones but no evidence of cholecystitis.  He does not have any tenderness in his right upper quadrant.  Labs reviewed and are nonconcerning.  I suppose that his back pain could be related to some referred pain from the gallstones but it seems to be more musculoskeletal in nature on my exam.  He does not have any radicular pain or signs of cauda equina.  No neurologic deficits.  He was given Toradol and fentanyl in the ED.  He seems to have some improvement in symptoms with this.  He was discharged home in good condition.  He was given a prescription for Mobic and a short course of hydrocodone.  He was encouraged to follow-up with his primary care provider.  He was given instructions on a gallbladder low-fat diet.  Return precautions were given.  Final Clinical Impression(s) / ED Diagnoses Final diagnoses:  Acute right-sided  low back pain without sciatica  Gallstones    Rx / DC Orders ED Discharge Orders          Ordered    meloxicam (MOBIC) 7.5 MG tablet  Daily        08/03/22 2025    HYDROcodone-acetaminophen (NORCO/VICODIN) 5-325 MG tablet  Every 4 hours PRN        08/03/22 2025              Malvin Johns, MD 08/03/22 2124

## 2022-08-03 NOTE — ED Triage Notes (Signed)
Sharp stabbing pain in right sided flank/ back. Started about a week ago. Intermittent. Ibuprofen yesterday with no relief. Did not take anything today. Denies any issue voiding

## 2022-08-03 NOTE — Discharge Instructions (Signed)
Follow-up with your primary care doctor for recheck within the next few days.  Return to the emergency room if you have any worsening symptoms.

## 2023-06-10 NOTE — H&P (Signed)
Patient's anticipated LOS is less than 2 midnights, meeting these requirements: - Younger than 69 - Lives within 1 hour of care - Has a competent adult at home to recover with post-op recover - NO history of  - Chronic pain requiring opiods  - Diabetes  - Coronary Artery Disease  - Heart failure  - Heart attack  - Stroke  - DVT/VTE  - Cardiac arrhythmia  - Respiratory Failure/COPD  - Renal failure  - Anemia  - Advanced Liver disease     [name] is an 67 y.o. male.    Chief Complaint: left shoulder pain and weakness  HPI: Pt is a 67 y.o. male complaining of left shoulder pain for multiple years. Pain had continually increased since the beginning. X-rays in the clinic show end-stage arthritic changes of the left shoulder. Pt has tried various conservative treatments which have failed to alleviate their symptoms, including injections and therapy. Various options are discussed with the patient. Risks, benefits and expectations were discussed with the patient. Patient understand the risks, benefits and expectations and wishes to proceed with surgery.   PCP:  Gwenlyn Found, MD  D/C Plans: Home  PMH: Past Medical History:  Diagnosis Date   ADHD (attention deficit hyperactivity disorder)    Arthritis    RIGHT knee   GERD (gastroesophageal reflux disease)    occasionally- on PRN meds   Hyperlipidemia    not consistenly taking meds   Insomnia    RLS (restless legs syndrome)    Smoker    quit about acouple yrs ago   Ulnar neuropathy at elbow, left    pt denies issue    PSH: Past Surgical History:  Procedure Laterality Date   COLONOSCOPY  2020   GM-MAC-adequate with Miralax/12 TA's   PROSTATE BIOPSY     THROAT SURGERY     Across from [facility]   TOTAL KNEE ARTHROPLASTY Right 10/2020   TOTAL SHOULDER ARTHROPLASTY Right 10/08/2013   Procedure: RIGHT TOTAL SHOULDER ARTHROPLASTY;  Surgeon: [name], MD;  Location: [facility] OR;  Service: Orthopedics;   Laterality: Right;   WISDOM TOOTH EXTRACTION      Social History:  reports that he quit smoking about 29 years ago. His smoking use included cigarettes. He has a 3.00 pack-year smoking history. He has never used smokeless tobacco. He reports current alcohol use of about 21.0 standard drinks of alcohol per week. He reports that he does not use drugs. BMI: Estimated body mass index is 30.41 kg/m as calculated from the following:   Height as of 12/26/20: 5\' 8"  (1.727 m).   Weight as of 12/26/20: 90.7 kg.  Lab Results  Component Value Date   ALBUMIN 4.3 08/03/2022   Diabetes: Patient does not have a diagnosis of diabetes.     Smoking Status:      Allergies:  No Known Allergies  Medications: No current facility-administered medications for this encounter.   Current Outpatient Medications  Medication Sig Dispense Refill   amphetamine-dextroamphetamine (ADDERALL XR) 20 MG 24 hr capsule Take 1 capsule (20 mg total) by mouth every morning. 30 capsule 0   aspirin 81 MG tablet Take 81 mg by mouth once a week.     Cholecalciferol (VITAMIN D PO) Take daily by mouth.     CIALIS 20 MG tablet TAKE ONE TABLET BY MOUTH ONCE DAILY AS NEEDED FOR ERECTILE DYSFUNCTION 10 tablet 5   Cyanocobalamin (VITAMIN B-12 PO) Take daily by mouth.     gabapentin (NEURONTIN)  300 MG capsule Take 2 capsules (600 mg total) by mouth 3 (three) times daily. Please call for yearly appt to continue refills.  May also request PCP to take over prescription. 540 capsule 0   HYDROcodone-acetaminophen (NORCO/VICODIN) 5-325 MG tablet Take 1 tablet by mouth every 4 (four) hours as needed. 12 tablet 0   linaclotide (LINZESS) 145 MCG CAPS capsule Linzess 145 mcg capsule     meloxicam (MOBIC) 7.5 MG tablet Take 1 tablet (7.5 mg total) by mouth daily. 20 tablet 0   methocarbamol (ROBAXIN) 500 MG tablet daily as needed.     omeprazole (PRILOSEC) 40 MG capsule Take 40 mg daily by mouth.  0   rosuvastatin (CRESTOR) 10 MG tablet Take  by mouth daily as needed.     valACYclovir (VALTREX) 1000 MG tablet Take 1 tablet by mouth twice daily for 2 days at the first sign of flare     zolpidem (AMBIEN) 10 MG tablet Take 1 tablet (10 mg total) by mouth at bedtime as needed for sleep. Will need appt for add'l refills. 30 tablet 0    No results found for this or any previous visit (from the past 48 hour(s)). No results found.  ROS: Pain with rom of the left upper extremity  Physical Exam: Alert and oriented 67 y.o. male in no acute distress Cranial nerves 2-12 intact Cervical spine: full rom with no tenderness, nv intact distally Chest: active breath sounds bilaterally, no wheeze rhonchi or rales Heart: regular rate and rhythm, no murmur Abd: non tender non distended with active bowel sounds Hip is stable with rom  Left shoulder painful and weak rom Nv intact distally No rashes or edema distally  Assessment/Plan Assessment: left shoulder cuff arthropathy  Plan:  Patient will undergo a left reverse total shoulder by Dr. Ranell Patrick at [facility] Risks benefits and expectations were discussed with the patient. Patient understand risks, benefits and expectations and wishes to proceed. Preoperative templating of the joint replacement has been completed, documented, and submitted to the Operating Room personnel in order to optimize intra-operative equipment management.   Alphonsa Overall PA-C, MPAS [facility] is now [employer] [address], [place], [place] [address] Phone: [phone] [email or web address] Facebook  [employer]

## 2023-06-10 NOTE — Progress Notes (Signed)
Surgery orders requested via Epic inbox. °

## 2023-06-13 ENCOUNTER — Encounter (HOSPITAL_COMMUNITY): Payer: Self-pay

## 2023-06-13 NOTE — Patient Instructions (Addendum)
SURGICAL WAITING ROOM VISITATION Patients having surgery or a procedure may have no more than 2 support people in the waiting area - these visitors may rotate.    Children under the age of 27 must have an adult with them who is not the patient.  If the patient needs to stay at the hospital during part of their recovery, the visitor guidelines for inpatient rooms apply. Pre-op nurse will coordinate an appropriate time for 1 support person to accompany patient in pre-op.  This support person may not rotate.    Please refer to the [facility] website for the visitor guidelines for Inpatients (after your surgery is over and you are in a regular room).      Your procedure is scheduled on: 07-04-23   Report to [facility] Main Entrance    Report to admitting at 5:15 AM   Call this number if you have problems the morning of surgery [phone]   Do not eat food :After Midnight.   After Midnight you may have the following liquids until 4:30 AM DAY OF SURGERY  Water Non-Citrus Juices (without pulp, NO RED-Apple, White grape, White cranberry) Black Coffee (NO MILK/CREAM OR CREAMERS, sugar ok)  Clear Tea (NO MILK/CREAM OR CREAMERS, sugar ok) regular and decaf                             Plain Jell-O (NO RED)                                           Fruit ices (not with fruit pulp, NO RED)                                     Popsicles (NO RED)                                                               Sports drinks like Gatorade (NO RED)                   The day of surgery:  Drink ONE (1) Pre-Surgery G2 at 4:30 AM the morning of surgery. Drink in one sitting. Do not sip.  This drink was given to you during your hospital  pre-op appointment visit. Nothing else to drink after completing the Pre-Surgery G2.          If you have questions, please contact your surgeon's office.   FOLLOW  ANY ADDITIONAL PRE OP INSTRUCTIONS YOU RECEIVED FROM YOUR SURGEON'S OFFICE!!!     Oral  Hygiene is also important to reduce your risk of infection.                                    Remember - BRUSH YOUR TEETH THE MORNING OF SURGERY WITH YOUR REGULAR TOOTHPASTE   Do NOT smoke after Midnight   Take these medicines the morning of surgery with A SIP OF WATER:   Gabapentin  Omeprazole  Valacyclovir if needed  You may not have any metal on your body including  jewelry, and body piercing             Do not wear lotions, powders, cologne, or deodorant              Men may shave face and neck.   Do not bring valuables to the hospital. [facility] IS NOT RESPONSIBLE   FOR VALUABLES.   Contacts, dentures or bridgework may not be worn into surgery.   Bring small overnight bag day of surgery.   DO NOT BRING YOUR HOME MEDICATIONS TO THE HOSPITAL. PHARMACY WILL DISPENSE MEDICATIONS LISTED ON YOUR MEDICATION LIST TO YOU DURING YOUR ADMISSION IN THE HOSPITAL!     Special Instructions: Bring a copy of your healthcare power of attorney and living will documents the day of surgery if you haven't scanned them before.              Please read over the following fact sheets you were given: IF YOU HAVE QUESTIONS ABOUT YOUR PRE-OP INSTRUCTIONS PLEASE CALL [phone] [facility]  If you received a COVID test during your pre-op visit  it is requested that you wear a mask when out in public, stay away from anyone that may not be feeling well and notify your surgeon if you develop symptoms. If you test positive for Covid or have been in contact with anyone that has tested positive in the last 10 days please notify you surgeon.   [place]- Preparing for Total Shoulder Arthroplasty    Before surgery, you can play an important role. Because skin is not sterile, your skin needs to be as free of germs as possible. You can reduce the number of germs on your skin by using the following products. Benzoyl Peroxide Gel Reduces the number of germs present on the skin Applied  twice a day to shoulder area starting two days before surgery    ==================================================================  Please follow these instructions carefully:  BENZOYL PEROXIDE 5% GEL  Please do not use if you have an allergy to benzoyl peroxide.   If your skin becomes reddened/irritated stop using the benzoyl peroxide.  Starting two days before surgery, apply as follows: Apply benzoyl peroxide in the morning and at night. Apply after taking a shower. If you are not taking a shower clean entire shoulder front, back, and side along with the armpit with a clean wet washcloth.  Place a quarter-sized dollop on your shoulder and rub in thoroughly, making sure to cover the front, back, and side of your shoulder, along with the armpit.   2 days before ____ AM   ____ PM              1 day before ____ AM   ____ PM                         Do this twice a day for two days.  (Last application is the night before surgery, AFTER using the CHG soap as described below).  Do NOT apply benzoyl peroxide gel on the day of surgery.    Pre-operative 5 CHG Bath Instructions   You can play a key role in reducing the risk of infection after surgery. Your skin needs to be as free of germs as possible. You can reduce the number of germs on your skin by washing with CHG (chlorhexidine gluconate) soap before surgery. CHG is an antiseptic soap that kills germs and continues  to kill germs even after washing.   DO NOT use if you have an allergy to chlorhexidine/CHG or antibacterial soaps. If your skin becomes reddened or irritated, stop using the CHG and notify one of our RNs at  [phone] .   Please shower with the CHG soap starting 4 days before surgery using the following schedule:     Please keep in mind the following:  DO NOT shave, including legs and underarms, starting the day of your first shower.   You may shave your face at any point before/day of surgery.  Place clean sheets on  your bed the day you start using CHG soap. Use a clean washcloth (not used since being washed) for each shower. DO NOT sleep with pets once you start using the CHG.   CHG Shower Instructions:  If you choose to wash your hair and private area, wash first with your normal shampoo/soap.  After you use shampoo/soap, rinse your hair and body thoroughly to remove shampoo/soap residue.  Turn the water OFF and apply about 3 tablespoons (45 ml) of CHG soap to a CLEAN washcloth.  Apply CHG soap ONLY FROM YOUR NECK DOWN TO YOUR TOES (washing for 3-5 minutes)  DO NOT use CHG soap on face, private areas, open wounds, or sores.  Pay special attention to the area where your surgery is being performed.  If you are having back surgery, having someone wash your back for you may be helpful. Wait 2 minutes after CHG soap is applied, then you may rinse off the CHG soap.  Pat dry with a clean towel  Put on clean clothes/pajamas   If you choose to wear lotion, please use ONLY the CHG-compatible lotions on the back of this paper.     Additional instructions for the day of surgery: DO NOT APPLY any lotions, deodorants, cologne, or perfumes.   Put on clean/comfortable clothes.  Brush your teeth.  Ask your nurse before applying any prescription medications to the skin.      CHG Compatible Lotions   Aveeno Moisturizing lotion  Cetaphil Moisturizing Cream  Cetaphil Moisturizing Lotion  Clairol Herbal Essence Moisturizing Lotion, Dry Skin  Clairol Herbal Essence Moisturizing Lotion, Extra Dry Skin  Clairol Herbal Essence Moisturizing Lotion, Normal Skin  Curel Age Defying Therapeutic Moisturizing Lotion with Alpha Hydroxy  Curel Extreme Care Body Lotion  Curel Soothing Hands Moisturizing Hand Lotion  Curel Therapeutic Moisturizing Cream, Fragrance-Free  Curel Therapeutic Moisturizing Lotion, Fragrance-Free  Curel Therapeutic Moisturizing Lotion, Original Formula  Eucerin Daily Replenishing Lotion   Eucerin Dry Skin Therapy Plus Alpha Hydroxy Crme  Eucerin Dry Skin Therapy Plus Alpha Hydroxy Lotion  Eucerin Original Crme  Eucerin Original Lotion  Eucerin Plus Crme Eucerin Plus Lotion  Eucerin TriLipid Replenishing Lotion  Keri Anti-Bacterial Hand Lotion  Keri Deep Conditioning Original Lotion Dry Skin Formula Softly Scented  Keri Deep Conditioning Original Lotion, Fragrance Free Sensitive Skin Formula  Keri Lotion Fast Absorbing Fragrance Free Sensitive Skin Formula  Keri Lotion Fast Absorbing Softly Scented Dry Skin Formula  Keri Original Lotion  Keri Skin Renewal Lotion Keri Silky Smooth Lotion  Keri Silky Smooth Sensitive Skin Lotion  Nivea Body Creamy Conditioning Oil  Nivea Body Extra Enriched Teacher, adult education Moisturizing Lotion Nivea Crme  Nivea Skin Firming Lotion  NutraDerm 30 Skin Lotion  NutraDerm Skin Lotion  NutraDerm Therapeutic Skin Cream  NutraDerm Therapeutic Skin Lotion  ProShield Protective Hand Cream  Provon moisturizing lotion  PATIENT SIGNATURE_________________________________  NURSE SIGNATURE__________________________________  ________________________________________________________________________    [name]  An incentive spirometer is a tool that can help keep your lungs clear and active. This tool measures how well you are filling your lungs with each breath. Taking long deep breaths may help reverse or decrease the chance of developing breathing (pulmonary) problems (especially infection) following: A long period of time when you are unable to move or be active. BEFORE THE PROCEDURE  If the spirometer includes an indicator to show your best effort, your nurse or respiratory therapist will set it to a desired goal. If possible, sit up straight or lean slightly forward. Try not to slouch. Hold the incentive spirometer in an upright position. INSTRUCTIONS FOR USE  Sit on the edge of your bed  if possible, or sit up as far as you can in bed or on a chair. Hold the incentive spirometer in an upright position. Breathe out normally. Place the mouthpiece in your mouth and seal your lips tightly around it. Breathe in slowly and as deeply as possible, raising the piston or the ball toward the top of the column. Hold your breath for 3-5 seconds or for as long as possible. Allow the piston or ball to fall to the bottom of the column. Remove the mouthpiece from your mouth and breathe out normally. Rest for a few seconds and repeat Steps 1 through 7 at least 10 times every 1-2 hours when you are awake. Take your time and take a few normal breaths between deep breaths. The spirometer may include an indicator to show your best effort. Use the indicator as a goal to work toward during each repetition. After each set of 10 deep breaths, practice coughing to be sure your lungs are clear. If you have an incision (the cut made at the time of surgery), support your incision when coughing by placing a pillow or rolled up towels firmly against it. Once you are able to get out of bed, walk around indoors and cough well. You may stop using the incentive spirometer when instructed by your caregiver.  RISKS AND COMPLICATIONS Take your time so you do not get dizzy or light-headed. If you are in pain, you may need to take or ask for pain medication before doing incentive spirometry. It is harder to take a deep breath if you are having pain. AFTER USE Rest and breathe slowly and easily. It can be helpful to keep track of a log of your progress. Your caregiver can provide you with a simple table to help with this. If you are using the spirometer at home, follow these instructions: SEEK MEDICAL CARE IF:  You are having difficultly using the spirometer. You have trouble using the spirometer as often as instructed. Your pain medication is not giving enough relief while using the spirometer. You develop fever of  100.5 F (38.1 C) or higher. SEEK IMMEDIATE MEDICAL CARE IF:  You cough up bloody sputum that had not been present before. You develop fever of 102 F (38.9 C) or greater. You develop worsening pain at or near the incision site. MAKE SURE YOU:  Understand these instructions. Will watch your condition. Will get help right away if you are not doing well or get worse. Document Released: 03/31/2007 Document Revised: 02/10/2012 Document Reviewed: 06/01/2007 [facility] Patient Information 2014 [place], [place].   ________________________________________________________________________

## 2023-06-13 NOTE — Progress Notes (Addendum)
COVID Vaccine Completed:  Yes  Date of COVID positive in last 90 days: No  PCP - Brett Fairy, MD Cardiologist - N/A  Chest x-ray - N/A EKG - 06-17-23 Epic Stress Test - Several years ago ECHO - N/A Cardiac Cath - N/A Pacemaker/ICD device last checked: Spinal Cord Stimulator:N/A  Bowel Prep - N/A  Sleep Study - N/A CPAP -   Prediabetes Fasting Blood Sugar -  Checks Blood Sugar - does not check   Last dose of GLP1 agonist-  N/A GLP1 instructions:  N/A   Last dose of SGLT-2 inhibitors-  N/A SGLT-2 instructions: N/A  Blood Thinner Instructions: N/A Aspirin Instructions: N/A Last Dose:  Activity level:  Can go up a flight of stairs and perform activities of daily living without stopping and without symptoms of chest pain or shortness of breath.  Able to exercise without symptoms  Anesthesia review: N/A  Patient denies shortness of breath, fever, cough and chest pain at PAT appointment  Patient verbalized understanding of instructions that were given to them at the PAT appointment. Patient was also instructed that they will need to review over the PAT instructions again at home before surgery.

## 2023-06-16 ENCOUNTER — Encounter (HOSPITAL_COMMUNITY): Payer: Self-pay

## 2023-06-17 ENCOUNTER — Encounter (HOSPITAL_COMMUNITY): Payer: Self-pay

## 2023-06-17 ENCOUNTER — Encounter (HOSPITAL_COMMUNITY)
Admission: RE | Admit: 2023-06-17 | Discharge: 2023-06-17 | Disposition: A | Discharge status: Home / Self Care | Payer: PPO | Source: Ambulatory Visit | Attending: Orthopedic Surgery | Admitting: Orthopedic Surgery

## 2023-06-17 ENCOUNTER — Other Ambulatory Visit: Payer: Self-pay

## 2023-06-17 VITALS — BP 143/83 | HR 78 | Temp 98.1°F | Resp 20 | Ht 68.0 in | Wt 202.4 lb

## 2023-06-17 DIAGNOSIS — Z01818 Encounter for other preprocedural examination: Secondary | ICD-10-CM | POA: Insufficient documentation

## 2023-06-17 DIAGNOSIS — I10 Essential (primary) hypertension: Secondary | ICD-10-CM | POA: Insufficient documentation

## 2023-06-17 HISTORY — DX: Prediabetes: R73.03

## 2023-06-17 HISTORY — DX: Essential (primary) hypertension: I10

## 2023-06-17 HISTORY — DX: Unspecified malignant neoplasm of skin, unspecified: C44.90

## 2023-06-17 LAB — BASIC METABOLIC PANEL
Anion gap: 12 (ref 5–15)
BUN: 17 mg/dL (ref 8–23)
CO2: 23 mmol/L (ref 22–32)
Calcium: 9.1 mg/dL (ref 8.9–10.3)
Chloride: 104 mmol/L (ref 98–111)
Creatinine, Ser: 0.96 mg/dL (ref 0.61–1.24)
GFR, Estimated: >60 mL/min (ref >60)
Glucose, Bld: 114 mg/dL — ABNORMAL HIGH (ref 70–99)
Potassium: 3.9 mmol/L (ref 3.5–5.1)
Sodium: 139 mmol/L (ref 135–145)

## 2023-06-17 LAB — CBC
HCT: 45.7 % (ref 39.0–52.0)
Hemoglobin: 14.8 g/dL (ref 13.0–17.0)
MCH: 28.1 pg (ref 26.0–34.0)
MCHC: 32.4 g/dL (ref 30.0–36.0)
MCV: 86.7 fL (ref 80.0–100.0)
Platelets: 202 10*3/uL (ref 150–400)
RBC: 5.27 MIL/uL (ref 4.22–5.81)
RDW: 14.3 % (ref 11.5–15.5)
WBC: 11.8 10*3/uL — ABNORMAL HIGH (ref 4.0–10.5)
nRBC: 0 % (ref 0.0–0.2)

## 2023-06-17 LAB — SURGICAL PCR SCREEN
MRSA, PCR: NEGATIVE
Staphylococcus aureus: NEGATIVE

## 2023-07-03 NOTE — Anesthesia Preprocedure Evaluation (Addendum)
Anesthesia Evaluation  Patient identified by MRN, date of birth, ID band Patient awake    Reviewed: Allergy & Precautions, NPO status , Patient's Chart, lab work & pertinent test results  History of Anesthesia Complications (+) history of anesthetic complications (reports being lethargic for months after previous shoulder surgery 8 years ago)  Airway Mallampati: III  TM Distance: >3 FB Neck ROM: Limited    Dental  (+) Dental Advisory Given   Pulmonary neg shortness of breath, neg sleep apnea, neg COPD, neg recent URI, Patient abstained from smoking., former smoker   Pulmonary exam normal breath sounds clear to auscultation       Cardiovascular hypertension, (-) angina (-) Past MI, (-) Cardiac Stents and (-) CABG + dysrhythmias (1st degree AV block, incomplete RBBB)  Rhythm:Regular Rate:Normal  HLD   Neuro/Psych neg Seizures PSYCHIATRIC DISORDERS (ADHD)      negative neurological ROS     GI/Hepatic Neg liver ROS,GERD  Medicated,,  Endo/Other  neg diabetes  Pre-diabetes  Renal/GU negative Renal ROS     Musculoskeletal  (+) Arthritis , Osteoarthritis,    Abdominal   Peds  Hematology negative hematology ROS (+)   Anesthesia Other Findings   Reproductive/Obstetrics                             Anesthesia Physical Anesthesia Plan  ASA: 2  Anesthesia Plan: General   Post-op Pain Management: Regional block* and Tylenol PO (pre-op)*   Induction: Intravenous  PONV Risk Score and Plan: 2 and Ondansetron, Dexamethasone and Treatment may vary due to age or medical condition  Airway Management Planned: Oral ETT  Additional Equipment:   Intra-op Plan:   Post-operative Plan: Extubation in OR  Informed Consent: I have reviewed the patients History and Physical, chart, labs and discussed the procedure including the risks, benefits and alternatives for the proposed anesthesia with the patient or  authorized representative who has indicated his/her understanding and acceptance.     Dental advisory given  Plan Discussed with: CRNA and Anesthesiologist  Anesthesia Plan Comments: (Discussed potential risks of nerve blocks including, but not limited to, infection, bleeding, nerve damage, seizures, pneumothorax, respiratory depression, and potential failure of the block. Alternatives to nerve blocks discussed. All questions answered.  Risks of general anesthesia discussed including, but not limited to, sore throat, hoarse voice, chipped/damaged teeth, injury to vocal cords, nausea and vomiting, allergic reactions, lung infection, heart attack, stroke, and death. All questions answered. )        Anesthesia Quick Evaluation

## 2023-07-04 ENCOUNTER — Encounter (HOSPITAL_COMMUNITY): Payer: Self-pay | Admitting: Orthopedic Surgery

## 2023-07-04 ENCOUNTER — Other Ambulatory Visit: Payer: Self-pay

## 2023-07-04 ENCOUNTER — Ambulatory Visit (HOSPITAL_BASED_OUTPATIENT_CLINIC_OR_DEPARTMENT_OTHER): Payer: PPO | Admitting: Anesthesiology

## 2023-07-04 ENCOUNTER — Encounter (HOSPITAL_COMMUNITY)
Admission: RE | Disposition: A | Discharge status: Home / Self Care | Payer: Self-pay | Source: Ambulatory Visit | Attending: Orthopedic Surgery

## 2023-07-04 ENCOUNTER — Ambulatory Visit (HOSPITAL_COMMUNITY)
Admission: RE | Admit: 2023-07-04 | Discharge: 2023-07-04 | Disposition: A | Discharge status: Home / Self Care | Payer: PPO | Source: Ambulatory Visit | Attending: Orthopedic Surgery | Admitting: Orthopedic Surgery

## 2023-07-04 ENCOUNTER — Ambulatory Visit (HOSPITAL_COMMUNITY): Payer: PPO

## 2023-07-04 ENCOUNTER — Ambulatory Visit (HOSPITAL_COMMUNITY): Payer: PPO | Admitting: Anesthesiology

## 2023-07-04 DIAGNOSIS — K219 Gastro-esophageal reflux disease without esophagitis: Secondary | ICD-10-CM | POA: Diagnosis not present

## 2023-07-04 DIAGNOSIS — M19012 Primary osteoarthritis, left shoulder: Secondary | ICD-10-CM | POA: Insufficient documentation

## 2023-07-04 DIAGNOSIS — Z791 Long term (current) use of non-steroidal anti-inflammatories (NSAID): Secondary | ICD-10-CM | POA: Insufficient documentation

## 2023-07-04 DIAGNOSIS — Z87891 Personal history of nicotine dependence: Secondary | ICD-10-CM | POA: Insufficient documentation

## 2023-07-04 DIAGNOSIS — Z96611 Presence of right artificial shoulder joint: Secondary | ICD-10-CM | POA: Insufficient documentation

## 2023-07-04 HISTORY — PX: REVERSE SHOULDER ARTHROPLASTY: SHX5054

## 2023-07-04 SURGERY — ARTHROPLASTY, SHOULDER, TOTAL, REVERSE
Anesthesia: General | Site: Shoulder | Laterality: Left

## 2023-07-04 MED ORDER — CEFAZOLIN SODIUM-DEXTROSE 2-4 GM/100ML-% IV SOLN
2.0000 g | INTRAVENOUS | Status: AC
  Administered 2023-07-04: 2 g via INTRAVENOUS
  Filled 2023-07-04: qty 100

## 2023-07-04 MED ORDER — BUPIVACAINE HCL (PF) 0.5 % IJ SOLN
INTRAMUSCULAR | Status: DC | PRN
Start: 2023-07-04 — End: 2023-07-04
  Administered 2023-07-04: 10 mL via PERINEURAL

## 2023-07-04 MED ORDER — ROCURONIUM BROMIDE 100 MG/10ML IV SOLN
INTRAVENOUS | Status: DC | PRN
  Administered 2023-07-04: 50 mg via INTRAVENOUS

## 2023-07-04 MED ORDER — FENTANYL CITRATE PF 50 MCG/ML IJ SOSY: 25.0000 ug | PREFILLED_SYRINGE | INTRAMUSCULAR | Status: DC | PRN

## 2023-07-04 MED ORDER — PHENYLEPHRINE HCL (PRESSORS) 10 MG/ML IV SOLN
INTRAVENOUS | Status: DC | PRN
  Administered 2023-07-04 (×4): 80 ug via INTRAVENOUS

## 2023-07-04 MED ORDER — AMISULPRIDE (ANTIEMETIC) 5 MG/2ML IV SOLN: 10.0000 mg | Freq: Once | INTRAVENOUS | Status: DC | PRN

## 2023-07-04 MED ORDER — FENTANYL CITRATE (PF) 100 MCG/2ML IJ SOLN
INTRAMUSCULAR | Status: AC
  Filled 2023-07-04: qty 2

## 2023-07-04 MED ORDER — PROPOFOL 10 MG/ML IV BOLUS
INTRAVENOUS | Status: AC
  Filled 2023-07-04: qty 20

## 2023-07-04 MED ORDER — ONDANSETRON HCL 4 MG PO TABS: 4.0000 mg | ORAL_TABLET | Freq: Three times a day (TID) | ORAL | 1 refills | Status: AC | PRN

## 2023-07-04 MED ORDER — OXYCODONE-ACETAMINOPHEN 5-325 MG PO TABS
1.0000 | ORAL_TABLET | ORAL | 0 refills | Status: AC | PRN
Start: 2023-07-04 — End: 2024-07-03

## 2023-07-04 MED ORDER — METOCLOPRAMIDE HCL 5 MG/ML IJ SOLN: 5.0000 mg | Freq: Three times a day (TID) | INTRAMUSCULAR | Status: DC | PRN

## 2023-07-04 MED ORDER — DEXAMETHASONE SODIUM PHOSPHATE 4 MG/ML IJ SOLN
INTRAMUSCULAR | Status: DC | PRN
Start: 2023-07-04 — End: 2023-07-04
  Administered 2023-07-04: 8 mg via INTRAVENOUS

## 2023-07-04 MED ORDER — ONDANSETRON HCL 4 MG/2ML IJ SOLN: 4.0000 mg | Freq: Four times a day (QID) | INTRAMUSCULAR | Status: DC | PRN

## 2023-07-04 MED ORDER — DEXAMETHASONE SODIUM PHOSPHATE 10 MG/ML IJ SOLN
INTRAMUSCULAR | Status: AC
  Filled 2023-07-04: qty 1

## 2023-07-04 MED ORDER — OXYCODONE HCL 5 MG PO TABS: 5.0000 mg | ORAL_TABLET | Freq: Once | ORAL | Status: DC | PRN

## 2023-07-04 MED ORDER — PHENYLEPHRINE HCL-NACL 20-0.9 MG/250ML-% IV SOLN
INTRAVENOUS | Status: DC | PRN
  Administered 2023-07-04: 35 ug/min via INTRAVENOUS

## 2023-07-04 MED ORDER — TRANEXAMIC ACID-NACL 1000-0.7 MG/100ML-% IV SOLN: 1000.0000 mg | Freq: Once | INTRAVENOUS | Status: DC

## 2023-07-04 MED ORDER — BUPIVACAINE-EPINEPHRINE 0.25% -1:200000 IJ SOLN
INTRAMUSCULAR | Status: AC
  Filled 2023-07-04: qty 1

## 2023-07-04 MED ORDER — 0.9 % SODIUM CHLORIDE (POUR BTL) OPTIME
TOPICAL | Status: DC | PRN
  Administered 2023-07-04: 1000 mL

## 2023-07-04 MED ORDER — SUGAMMADEX SODIUM 200 MG/2ML IV SOLN
INTRAVENOUS | Status: DC | PRN
Start: 2023-07-04 — End: 2023-07-04
  Administered 2023-07-04: 200 mg via INTRAVENOUS

## 2023-07-04 MED ORDER — STERILE WATER FOR IRRIGATION IR SOLN
Status: DC | PRN
  Administered 2023-07-04: 2000 mL

## 2023-07-04 MED ORDER — LACTATED RINGERS IV SOLN: INTRAVENOUS | Status: DC

## 2023-07-04 MED ORDER — OXYCODONE HCL 5 MG PO TABS: 5.0000 mg | ORAL_TABLET | ORAL | Status: DC | PRN

## 2023-07-04 MED ORDER — LACTATED RINGERS IV SOLN: INTRAVENOUS | Status: DC | PRN

## 2023-07-04 MED ORDER — PROPOFOL 10 MG/ML IV BOLUS
INTRAVENOUS | Status: DC | PRN
  Administered 2023-07-04: 200 mg via INTRAVENOUS

## 2023-07-04 MED ORDER — ONDANSETRON HCL 4 MG PO TABS: 4.0000 mg | ORAL_TABLET | Freq: Four times a day (QID) | ORAL | Status: DC | PRN

## 2023-07-04 MED ORDER — LIDOCAINE HCL (CARDIAC) PF 100 MG/5ML IV SOSY
PREFILLED_SYRINGE | INTRAVENOUS | Status: DC | PRN
  Administered 2023-07-04: 60 mg via INTRAVENOUS

## 2023-07-04 MED ORDER — BUPIVACAINE-EPINEPHRINE (PF) 0.25% -1:200000 IJ SOLN
INTRAMUSCULAR | Status: DC | PRN
  Administered 2023-07-04: 11 mL

## 2023-07-04 MED ORDER — OXYCODONE HCL 5 MG/5ML PO SOLN: 5.0000 mg | Freq: Once | ORAL | Status: DC | PRN

## 2023-07-04 MED ORDER — METHOCARBAMOL 500 MG PO TABS: 500.0000 mg | ORAL_TABLET | Freq: Four times a day (QID) | ORAL | Status: DC | PRN

## 2023-07-04 MED ORDER — ORAL CARE MOUTH RINSE: 15.0000 mL | Freq: Once | OROMUCOSAL | Status: AC

## 2023-07-04 MED ORDER — ONDANSETRON HCL 4 MG/2ML IJ SOLN
INTRAMUSCULAR | Status: AC
  Filled 2023-07-04: qty 2

## 2023-07-04 MED ORDER — ACETAMINOPHEN 500 MG PO TABS
1000.0000 mg | ORAL_TABLET | Freq: Once | ORAL | Status: AC
  Administered 2023-07-04: 1000 mg via ORAL
  Filled 2023-07-04: qty 2

## 2023-07-04 MED ORDER — FENTANYL CITRATE (PF) 100 MCG/2ML IJ SOLN
INTRAMUSCULAR | Status: DC | PRN
  Administered 2023-07-04: 100 ug via INTRAVENOUS

## 2023-07-04 MED ORDER — METHOCARBAMOL 500 MG PO TABS: 500.0000 mg | ORAL_TABLET | Freq: Three times a day (TID) | ORAL | 1 refills | Status: AC | PRN

## 2023-07-04 MED ORDER — ONDANSETRON HCL 4 MG/2ML IJ SOLN
INTRAMUSCULAR | Status: DC | PRN
  Administered 2023-07-04: 4 mg via INTRAVENOUS

## 2023-07-04 MED ORDER — METOCLOPRAMIDE HCL 5 MG PO TABS: 5.0000 mg | ORAL_TABLET | Freq: Three times a day (TID) | ORAL | Status: DC | PRN

## 2023-07-04 MED ORDER — BUPIVACAINE LIPOSOME 1.3 % IJ SUSP
INTRAMUSCULAR | Status: DC | PRN
  Administered 2023-07-04: 10 mL via PERINEURAL

## 2023-07-04 MED ORDER — CHLORHEXIDINE GLUCONATE 0.12 % MT SOLN
15.0000 mL | Freq: Once | OROMUCOSAL | Status: AC
  Administered 2023-07-04: 15 mL via OROMUCOSAL

## 2023-07-04 MED ORDER — ACETAMINOPHEN 325 MG PO TABS: 325.0000 mg | ORAL_TABLET | Freq: Four times a day (QID) | ORAL | Status: DC | PRN

## 2023-07-04 MED ORDER — METHOCARBAMOL 500 MG IVPB - SIMPLE MED: 500.0000 mg | Freq: Four times a day (QID) | INTRAVENOUS | Status: DC | PRN

## 2023-07-04 MED ORDER — TRANEXAMIC ACID-NACL 1000-0.7 MG/100ML-% IV SOLN
1000.0000 mg | INTRAVENOUS | Status: AC
  Administered 2023-07-04: 1000 mg via INTRAVENOUS
  Filled 2023-07-04: qty 100

## 2023-07-04 SURGICAL SUPPLY — 70 items
AID PSTN UNV HD RSTRNT DISP (MISCELLANEOUS) ×1
BAG COUNTER SPONGE SURGICOUNT (BAG) IMPLANT
BAG SPEC THK2 15X12 ZIP CLS (MISCELLANEOUS)
BAG SPNG CNTER NS LX DISP (BAG)
BAG ZIPLOCK 12X15 (MISCELLANEOUS) IMPLANT
BIT DRILL 1.6MX128 (BIT) IMPLANT
BIT DRILL 170X2.5X (BIT) IMPLANT
BIT DRL 170X2.5X (BIT) ×1
BLADE SAG 18X100X1.27 (BLADE) ×1 IMPLANT
COVER BACK TABLE 60X90IN (DRAPES) ×1 IMPLANT
COVER SURGICAL LIGHT HANDLE (MISCELLANEOUS) ×1 IMPLANT
CUP HUMERAL 42 PLUS 3 (Orthopedic Implant) IMPLANT
DRAPE INCISE IOBAN 66X45 STRL (DRAPES) ×1 IMPLANT
DRAPE ORTHO SPLIT 77X108 STRL (DRAPES) ×2
DRAPE SHEET LG 3/4 BI-LAMINATE (DRAPES) ×1 IMPLANT
DRAPE SURG ORHT 6 SPLT 77X108 (DRAPES) ×2 IMPLANT
DRAPE TOP 10253 STERILE (DRAPES) ×1 IMPLANT
DRAPE U-SHAPE 47X51 STRL (DRAPES) ×1 IMPLANT
DRILL 2.5 (BIT) ×1
DRSG ADAPTIC 3X8 NADH LF (GAUZE/BANDAGES/DRESSINGS) ×1 IMPLANT
DURAPREP 26ML APPLICATOR (WOUND CARE) ×1 IMPLANT
ELECT BLADE TIP CTD 4 INCH (ELECTRODE) ×1 IMPLANT
ELECT NDL TIP 2.8 STRL (NEEDLE) ×1 IMPLANT
ELECT NEEDLE TIP 2.8 STRL (NEEDLE) ×1 IMPLANT
ELECT REM PT RETURN 15FT ADLT (MISCELLANEOUS) ×1 IMPLANT
EPIPHSYSI CENTER SZ 2 LT (Shoulder) ×1 IMPLANT
EPIPHYSIS CENTER SZ 2 LT (Shoulder) IMPLANT
FACESHIELD WRAPAROUND (MASK) ×1 IMPLANT
FACESHIELD WRAPAROUND OR TEAM (MASK) ×1 IMPLANT
GAUZE PAD ABD 8X10 STRL (GAUZE/BANDAGES/DRESSINGS) ×1 IMPLANT
GAUZE SPONGE 4X4 12PLY STRL (GAUZE/BANDAGES/DRESSINGS) ×1 IMPLANT
GLENOSPHERE XTEND LAT 42+0 STD (Miscellaneous) IMPLANT
GLOVE BIOGEL PI IND STRL 7.5 (GLOVE) ×1 IMPLANT
GLOVE BIOGEL PI IND STRL 8.5 (GLOVE) ×1 IMPLANT
GLOVE ORTHO TXT STRL SZ7.5 (GLOVE) ×1 IMPLANT
GLOVE SURG ORTHO 8.5 STRL (GLOVE) ×1 IMPLANT
GOWN STRL REUS W/ TWL XL LVL3 (GOWN DISPOSABLE) ×2 IMPLANT
GOWN STRL REUS W/TWL XL LVL3 (GOWN DISPOSABLE) ×2
KIT BASIN OR (CUSTOM PROCEDURE TRAY) ×1 IMPLANT
KIT TURNOVER KIT A (KITS) IMPLANT
MANIFOLD NEPTUNE II (INSTRUMENTS) ×1 IMPLANT
METAGLENE DELTA EXTEND (Trauma) IMPLANT
METAGLENE DXTEND (Trauma) ×1 IMPLANT
NDL MAYO CATGUT SZ4 TPR NDL (NEEDLE) IMPLANT
NEEDLE MAYO CATGUT SZ4 (NEEDLE) IMPLANT
NS IRRIG 1000ML POUR BTL (IV SOLUTION) ×1 IMPLANT
PACK SHOULDER (CUSTOM PROCEDURE TRAY) ×1 IMPLANT
PIN GUIDE 1.2 (PIN) IMPLANT
PIN GUIDE GLENOPHERE 1.5MX300M (PIN) IMPLANT
PIN METAGLENE 2.5 (PIN) IMPLANT
RESTRAINT HEAD UNIVERSAL NS (MISCELLANEOUS) ×1 IMPLANT
SCREW 4.5X18MM (Screw) ×2 IMPLANT
SCREW 4.5X24MM (Screw) ×1 IMPLANT
SCREW BN 18X4.5XSTRL SHLDR (Screw) IMPLANT
SCREW BN 24X4.5XLCK STRL (Screw) IMPLANT
SCREW LOCK DELTA XTEND 4.5X30 (Screw) IMPLANT
SLING ARM FOAM STRAP LRG (SOFTGOODS) IMPLANT
SPIKE FLUID TRANSFER (MISCELLANEOUS) ×1 IMPLANT
SPONGE T-LAP 4X18 ~~LOC~~+RFID (SPONGE) IMPLANT
STEM DELTA DIA 10 HA (Stem) IMPLANT
STRIP CLOSURE SKIN 1/2X4 (GAUZE/BANDAGES/DRESSINGS) ×1 IMPLANT
SUT FIBERWIRE #2 38 T-5 BLUE (SUTURE) ×1
SUT MNCRL AB 4-0 PS2 18 (SUTURE) ×1 IMPLANT
SUT VIC AB 0 CT1 36 (SUTURE) ×1 IMPLANT
SUT VIC AB 0 CT2 27 (SUTURE) ×1 IMPLANT
SUT VIC AB 2-0 CT1 27 (SUTURE) ×1
SUT VIC AB 2-0 CT1 TAPERPNT 27 (SUTURE) ×1 IMPLANT
SUTURE FIBERWR #2 38 T-5 BLUE (SUTURE) ×1 IMPLANT
TAPE CLOTH SURG 6X10 WHT LF (GAUZE/BANDAGES/DRESSINGS) IMPLANT
TOWEL OR 17X26 10 PK STRL BLUE (TOWEL DISPOSABLE) ×1 IMPLANT

## 2023-07-04 NOTE — Interval H&P Note (Signed)
History and Physical Interval Note:  07/04/2023 7:11 AM  [name]  has presented today for surgery, with the diagnosis of left shoulder osteoarthritis.  The various methods of treatment have been discussed with the patient and family. After consideration of risks, benefits and other options for treatment, the patient has consented to  Procedure(s) with comments: REVERSE SHOULDER ARTHROPLASTY (Left) - choice with interscalene block as a surgical intervention.  The patient's history has been reviewed, patient examined, no change in status, stable for surgery.  I have reviewed the patient's chart and labs.  Questions were answered to the patient's satisfaction.     [name]

## 2023-07-04 NOTE — Anesthesia Procedure Notes (Signed)
Procedure Name: Intubation Date/Time: 07/04/2023 7:52 AM  Performed by: Deri Fuelling, CRNAPre-anesthesia Checklist: Patient identified, Emergency Drugs available, Suction available and Patient being monitored Patient Re-evaluated:Patient Re-evaluated prior to induction Oxygen Delivery Method: Circle system utilized Preoxygenation: Pre-oxygenation with 100% oxygen Induction Type: IV induction Ventilation: Mask ventilation without difficulty Laryngoscope Size: Mac and 4 Grade View: Grade IV Tube type: Oral Tube size: 7.5 mm Number of attempts: 1 Airway Equipment and Method: Stylet and Oral airway Placement Confirmation: ETT inserted through vocal cords under direct vision, positive ETCO2 and breath sounds checked- equal and bilateral Secured at: 21 cm Tube secured with: Tape Dental Injury: Teeth and Oropharynx as per pre-operative assessment  Future Recommendations: Recommend- induction with short-acting agent, and alternative techniques readily available

## 2023-07-04 NOTE — Transfer of Care (Signed)
Immediate Anesthesia Transfer of Care Note  Patient: [name]  Procedure(s) Performed: LEFT REVERSE SHOULDER ARTHROPLASTY (Left: Shoulder)  Patient Location: PACU  Anesthesia Type:GA combined with regional for post-op pain  Level of Consciousness: awake and alert   Airway & Oxygen Therapy: Patient Spontanous Breathing and Patient connected to face mask oxygen  Post-op Assessment: Report given to RN and Post -op Vital signs reviewed and stable  Post vital signs: Reviewed and stable  Last Vitals:  Vitals Value Taken Time  BP 104/69 07/04/23 0920  Temp    Pulse 76 07/04/23 0921  Resp 12 07/04/23 0921  SpO2 91 % 07/04/23 0921  Vitals shown include unfiled device data.  Last Pain:  Vitals:   07/04/23 0602  TempSrc: Oral  PainSc:          Complications: No notable events documented.

## 2023-07-04 NOTE — Evaluation (Signed)
Occupational Therapy Evaluation and Discharge Patient Details Name: [name] MRN: [id number] DOB: 12-23-1955 Today's Date: 07/04/2023   History of Present Illness Pt is a 67 yo male s/p LEFT REVERSE SHOULDER ARTHROPLASTY   Clinical Impression   This 67 yo male admitted and underwent above presents to acute OT with all education completed with pt and wife, we will D/C from acute OT.      Recommendations for follow up therapy are one component of a multi-disciplinary discharge planning process, led by the attending physician.  Recommendations may be updated based on patient status, additional functional criteria and insurance authorization.   Assistance Recommended at Discharge PRN  Patient can return home with the following A little help with bathing/dressing/bathroom;A little help with walking and/or transfers;Assistance with cooking/housework;Assist for transportation    Functional Status Assessment  Patient has had a recent decline in their functional status and demonstrates the ability to make significant improvements in function in a reasonable and predictable amount of time. (without further need for OT services due to all education completed and post op handouts provided)  Equipment Recommendations  Other (comment)       Precautions / Restrictions Precautions Precautions: Shoulder Type of Shoulder Precautions: Sling at all times except ADL/exercise YES ;  Non weight bearing YES ;  AROM elbow, wrist and hand to tolerance YES ; PROM of shoulder NO;  AROM of shoulder NO;   OT Consult Special Instructions: ok for gentle ADLs with hand to face, limit load bearing Shoulder Interventions: Shoulder sling/immobilizer Precaution Booklet Issued: Yes (comment) Required Braces or Orthoses: Sling Restrictions Weight Bearing Restrictions: Yes LUE Weight Bearing: Non weight bearing      Mobility Bed Mobility               General bed mobility comments: Pt sitting up in  recliner in BAY D of PACU    Transfers Overall transfer level: Needs assistance Equipment used: None Transfers: Sit to/from Stand Sit to Stand: Min guard                  Balance Overall balance assessment: Mild deficits observed, not formally tested                                              Vision Patient Visual Report: No change from baseline              Pertinent Vitals/Pain Pain Assessment Pain Assessment: No/denies pain (block has not worn off yet)     Hand Dominance Right   Extremity/Trunk Assessment Upper Extremity Assessment Upper Extremity Assessment: LUE deficits/detail LUE Deficits / Details: shoulder surgery; starting to get some movement back in arm but not feeling as of yet LUE Coordination: decreased fine motor;decreased gross motor           Communication Communication Communication: No difficulties   Cognition Arousal/Alertness: Awake/alert Behavior During Therapy: WFL for tasks assessed/performed Overall Cognitive Status: Within Functional Limits for tasks assessed                                       General Comments  still a little groggy from [place]    Exercises Other Exercises Other Exercises: Pt performed 10 reps of AAROM for elbow to hand exercises--needed A to hold  weight of arm while he did movements due to block not worn off yet   Shoulder Instructions Shoulder Instructions Donning/doffing shirt without moving shoulder: Caregiver independent with task Method for sponge bathing under operated UE:  (caregiver and patient verbalized understanding of using gravity to bring arm away from body and caregiver using "see-saw" method once I demonstrated this.) Donning/doffing sling/immobilizer: Caregiver independent with task Correct positioning of sling/immobilizer: Caregiver independent with task Pendulum exercises (written home exercise program):  (NA) ROM for elbow, wrist and digits of  operated UE: Caregiver independent with task Sling wearing schedule (on at all times/off for ADL's): Independent Proper positioning of operated UE when showering:  (verbalized understanding) Dressing change:  (per post op order) Positioning of UE while sleeping:  (verbalized understanding)    Home Living Family/patient expects to be discharged to:: Private residence Living Arrangements: Spouse/significant other Available Help at Discharge: Family;Available 24 hours/day Type of Home: House                                  Prior Functioning/Environment Prior Level of Function : Independent/Modified Independent;Driving                        OT Problem List: Decreased range of motion;Decreased strength;Impaired balance (sitting and/or standing)         OT Goals(Current goals can be found in the care plan section) Acute Rehab OT Goals Patient Stated Goal: home today         AM-PAC OT "6 Clicks" Daily Activity     Outcome Measure Help from another person eating meals?: A Little Help from another person taking care of personal grooming?: A Little Help from another person toileting, which includes using toliet, bedpan, or urinal?: A Little Help from another person bathing (including washing, rinsing, drying)?: A Little Help from another person to put on and taking off regular upper body clothing?: A Lot Help from another person to put on and taking off regular lower body clothing?: A Lot 6 Click Score: 16   End of Session Equipment Utilized During Treatment:  (sling) Nurse Communication:  (pt ready to go from OT standpoint)  Activity Tolerance: Patient tolerated treatment well Patient left: in chair  OT Visit Diagnosis: Unsteadiness on feet (R26.81)                Time: 1100-1126 OT Time Calculation (min): 26 min Charges:  OT General Charges $OT Visit: 1 Visit OT Evaluation $OT Eval Moderate Complexity: 1 Mod OT Treatments $Therapeutic Exercise: 8-22  mins  Lindon Romp OT Acute Rehabilitation Services Office [phone]    Evette Georges 07/04/2023, 11:40 AM

## 2023-07-04 NOTE — Op Note (Signed)
NAME: KAMEN, HANKEN MEDICAL RECORD NO: [id number] ACCOUNT NO: [id number] DATE OF BIRTH: November 15, 1956 FACILITY: [facility] LOCATION: [facility] PHYSICIAN: Almedia Balls. Ranell Patrick, MD  Operative Report   DATE OF PROCEDURE: 07/04/2023  PREOPERATIVE DIAGNOSIS:  Left shoulder end-stage osteoarthritis  POSTOPERATIVE DIAGNOSIS:  Left shoulder end-stage osteoarthritis.  PROCEDURE PERFORMED:  Left reverse shoulder replacement using DePuy Delta Xtend prosthesis with no subscap repair.  ATTENDING SURGEON:  Almedia Balls. Ranell Patrick, MD  ASSISTANT:  Konrad Felix Dixon, [place], who was scrubbed during the entire procedure and necessary for satisfactory completion of surgery.  ANESTHESIA:  General anesthesia was used plus interscalene block.  ESTIMATED BLOOD LOSS:  150 mL  FLUID REPLACEMENT:  1500 mL crystalloid.  COUNTS:  Instrument counts correct.  COMPLICATIONS:  None.  ANTIBIOTICS:  Perioperative antibiotics were given.  INDICATIONS:  The patient is a 67 year old male who presents with a history of worsening left shoulder pain secondary to end-stage arthritis with rotator cuff dysfunction.  The patient has failed an extended period of conservative management, desires  operative treatment to eliminate pain and restore function.  Informed consent obtained.  DESCRIPTION OF PROCEDURE:  After an adequate level of anesthesia was achieved, the patient was positioned in modified beach chair position.  Left shoulder correctly identified and sterile prep and drape performed.  Timeout called, verifying correct  patient, correct site. We entered the patient's shoulder using a standard deltopectoral approach, starting at the coracoid process and extending down the anterior humerus.  Dissection down through subcutaneous tissues using Bovie.  Cephalic vein was  identified and taken laterally with the deltoid, pectoralis taken medially.  Conjoined tendon identified and retracted medially.  We placed our deep retractors and  tenodesed the biceps in situ with 0 Vicryl figure-of-eight suture x2.  We then released  the subscapularis subperiosteally off the lesser tuberosity.  There was extensive tendon tearing within the subscapularis and we tagged the remnant to protect the axillary nerve. We released the inferior capsule extending the shoulder.  We then released  the biceps in the anterior supraspinatus and we were able to deliver the humeral head out of the wound.  We placed a deltoid retractor and had good exposure of the superior head of the glenoid.  There was no cartilage remaining on the glenohumeral joint.   We entered the proximal humerus with a 6 mm reamer.  We then reamed up to a size 10.  The patient's canal tapered significantly distally.  Once we had our 10 mm T handle guide in, we resected the head with the head resection guide in 20 degrees of  retroversion.  We removed the head at the back table for bone graft.  We then removed excess osteophytes with a rongeur all the way around posteriorly.  Next, we extended the shoulder posteriorly and placed our deep retractors, getting good glenoid  exposure. Removed the biceps stump and the labrum in its entirety and the capsule which was diseased and inflamed.  Once we had good exposure of the glenoid and our deep retractors placed, we identified the starting point for our guide pin.  We placed  our guide pin centered low and angled inferiorly slightly.  We then reamed for the metaglene baseplate, did our peripheral hand reaming, drilled out our central peg hole and impacted the HA coated press fit metaglene into position.  Next, we placed a 24  screw inferiorly, a 30 screw superiorly and two 18 screws, one anteriorly and posteriorly. We locked the superior and inferior screws.  We  had very good baseplate security and screw purchase.  At this point, we applied a 42+0 standard glenosphere to the  baseplate and secured that with a screwdriver.  I did a finger sweep to make  sure we had no soft tissue caught up in the bearing.  Next, we went to the humeral side and reamed for the 2 left metaphysis.  Next, we trialled with the 10 stem and the 2 left  metaphysis set on the 0 setting and impacted in 20 degrees of retroversion.  We reduced the shoulder with a 42+3 poly.  We were pleased with our soft tissue balancing and stability throughout a full arc of motion.  We removed the trial components,  irrigated thoroughly. Used available bone graft from the humeral head in impaction grafting technique with the HA coated press fit 10 stem and the 2 left metaphysis. We impacted that into position and then selected the real 42+3 polyethylene spacer and  placed that on the humeral tray.  We impacted that and then reduced the shoulder.  Nice little pop as it reduced.  Appropriate tension on the conjoined and good stability throughout a full arc of motion including no gapping with inferior pole or external  rotation.  We irrigated again and then removed the subscap remnant.  We then repaired deltopectoral interval with 0 Vicryl suture anatomically, followed by 2-0 Vicryl for subcutaneous closure and a running 4-0 Monocryl for skin.  Steri-Strips applied  followed by sterile dressing.  The patient tolerated surgery well.   VAI D: 07/04/2023 9:29:12 am T: 07/04/2023 9:41:00 am  JOB: [id number]/ [id number]

## 2023-07-04 NOTE — Anesthesia Procedure Notes (Signed)
Anesthesia Regional Block: Interscalene brachial plexus block   Pre-Anesthetic Checklist: , timeout performed,  Correct Patient, Correct Site, Correct Laterality,  Correct Procedure, Correct Position, site marked,  Risks and benefits discussed,  Surgical consent,  Pre-op evaluation,  At surgeon's request and post-op pain management  Laterality: Left  Prep: chloraprep       Needles:  Injection technique: Single-shot  Needle Type: Echogenic Stimulator Needle     Needle Length: 9cm  Needle Gauge: 21     Additional Needles:   Procedures:,,,, ultrasound used (permanent image in chart),,    Narrative:  Start time: 07/04/2023 7:02 AM End time: 07/04/2023 7:05 AM Injection made incrementally with aspirations every 5 mL.  Performed by: Personally  Anesthesiologist: [name], MD  Additional Notes: Discussed risks and benefits of nerve block including, but not limited to, prolonged and/or permanent nerve injury involving sensory and/or motor function. Monitors were applied and a time-out was performed. The nerve and associated structures were visualized under ultrasound guidance. After negative aspiration, local anesthetic was slowly injected around the nerve. There was no evidence of high pressure during the procedure. There were no paresthesias. VSS remained stable and the patient tolerated the procedure well.

## 2023-07-04 NOTE — Anesthesia Postprocedure Evaluation (Signed)
Anesthesia Post Note  Patient: [name]  Procedure(s) Performed: LEFT REVERSE SHOULDER ARTHROPLASTY (Left: Shoulder)     Patient location during evaluation: PACU Anesthesia Type: General Level of consciousness: awake Pain management: pain level controlled Vital Signs Assessment: post-procedure vital signs reviewed and stable Respiratory status: spontaneous breathing, nonlabored ventilation and respiratory function stable Cardiovascular status: blood pressure returned to baseline and stable Postop Assessment: no apparent nausea or vomiting Anesthetic complications: no   No notable events documented.  Last Vitals:  Vitals:   07/04/23 1015 07/04/23 1030  BP: (!) 118/92 (!) 125/90  Pulse: 74 68  Resp: 16   Temp: 36.4 C   SpO2: 91% 90%    Last Pain:  Vitals:   07/04/23 1015  TempSrc:   PainSc: Asleep                 [name]

## 2023-07-04 NOTE — Discharge Instructions (Signed)
Ice to the shoulder constantly.  Keep the incision covered and clean and dry for one week, then ok to get it wet in the shower. Leave the incision uncovered after one week to get air  Do exercise as instructed several times per day.   DO NOT reach behind your back or push up out of a chair with the operative arm.  Use a sling while you are up and around for comfort, may remove while seated.  Keep pillow propped behind the operative elbow.  Follow up with Dr Ranell Patrick in two weeks in the office, call [phone] for appt     PLEASE CALL Dr Ranell Patrick (cell) [phone] with any questions or concerns

## 2023-07-04 NOTE — Brief Op Note (Signed)
07/04/2023  9:21 AM  PATIENT:  Kerrin Mo  67 y.o. male  PRE-OPERATIVE DIAGNOSIS:  left shoulder osteoarthritis, end stage  POST-OPERATIVE DIAGNOSIS:  left shoulder osteoarthritis, end stage  PROCEDURE:  Procedure(s) with comments: LEFT REVERSE SHOULDER ARTHROPLASTY (Left) - choice with interscalene block DePuy Delta Xtend with NO subscap repair  SURGEON:  Surgeons and Role:    Beverely Low, MD - Primary  PHYSICIAN ASSISTANT:   ASSISTANTS: Thea Gist, PA-C   ANESTHESIA:   regional and general  EBL:  150 mL   BLOOD ADMINISTERED:none  DRAINS: none   LOCAL MEDICATIONS USED:  MARCAINE     SPECIMEN:  No Specimen  DISPOSITION OF SPECIMEN:  N/A  COUNTS:  YES  TOURNIQUET:  * No tourniquets in log *  DICTATION: .Other Dictation: Dictation Number [id number]  PLAN OF CARE: Discharge to home after PACU  PATIENT DISPOSITION:  PACU - hemodynamically stable.   Delay start of Pharmacological VTE agent (>24hrs) due to surgical blood loss or risk of bleeding: not applicable

## 2023-07-07 ENCOUNTER — Encounter (HOSPITAL_COMMUNITY): Payer: Self-pay | Admitting: Orthopedic Surgery

## 2023-12-15 ENCOUNTER — Encounter: Payer: Self-pay | Admitting: Gastroenterology

## 2024-01-26 ENCOUNTER — Ambulatory Visit (AMBULATORY_SURGERY_CENTER): Payer: HMO

## 2024-01-26 VITALS — Ht 68.5 in | Wt 200.0 lb

## 2024-01-26 DIAGNOSIS — Z860100 Personal history of colon polyps, unspecified: Secondary | ICD-10-CM

## 2024-01-26 MED ORDER — SUTAB 1479-225-188 MG PO TABS
ORAL_TABLET | ORAL | 0 refills | Status: DC
Start: 2024-01-26 — End: 2024-02-24

## 2024-01-26 NOTE — Progress Notes (Signed)

## 2024-02-13 ENCOUNTER — Encounter: Payer: PPO | Admitting: Gastroenterology

## 2024-02-18 ENCOUNTER — Encounter: Payer: Self-pay | Admitting: Gastroenterology

## 2024-02-24 ENCOUNTER — Ambulatory Visit: Payer: HMO | Admitting: Gastroenterology

## 2024-02-24 ENCOUNTER — Encounter: Payer: Self-pay | Admitting: Gastroenterology

## 2024-02-24 VITALS — BP 123/81 | HR 74 | Temp 98.5°F | Resp 11 | Ht 68.0 in | Wt 200.0 lb

## 2024-02-24 DIAGNOSIS — Z1211 Encounter for screening for malignant neoplasm of colon: Secondary | ICD-10-CM

## 2024-02-24 DIAGNOSIS — K644 Residual hemorrhoidal skin tags: Secondary | ICD-10-CM

## 2024-02-24 DIAGNOSIS — K635 Polyp of colon: Secondary | ICD-10-CM | POA: Diagnosis not present

## 2024-02-24 DIAGNOSIS — D122 Benign neoplasm of ascending colon: Secondary | ICD-10-CM

## 2024-02-24 DIAGNOSIS — Z860100 Personal history of colon polyps, unspecified: Secondary | ICD-10-CM

## 2024-02-24 DIAGNOSIS — D127 Benign neoplasm of rectosigmoid junction: Secondary | ICD-10-CM

## 2024-02-24 DIAGNOSIS — D123 Benign neoplasm of transverse colon: Secondary | ICD-10-CM

## 2024-02-24 DIAGNOSIS — K641 Second degree hemorrhoids: Secondary | ICD-10-CM

## 2024-02-24 DIAGNOSIS — D124 Benign neoplasm of descending colon: Secondary | ICD-10-CM | POA: Diagnosis not present

## 2024-02-24 DIAGNOSIS — Z860101 Personal history of adenomatous and serrated colon polyps: Secondary | ICD-10-CM

## 2024-02-24 MED ORDER — SODIUM CHLORIDE 0.9 % IV SOLN: 500.0000 mL | INTRAVENOUS | Status: DC

## 2024-02-24 NOTE — Patient Instructions (Addendum)
 Handouts provided on polyps and hemorrhoids.  Recommend a high-fiber diet (see handout). Use FiberCon 1-2 tablets by mouth daily.  Continue present medications.  Await pathology results.  Repeat colonoscopy in 3/5/7 years for surveillance based on pathology results.   YOU HAD AN ENDOSCOPIC PROCEDURE TODAY AT THE [facility]:   Refer to the procedure report that was given to you for any specific questions about what was found during the examination.  If the procedure report does not answer your questions, please call your gastroenterologist to clarify.  If you requested that your care partner not be given the details of your procedure findings, then the procedure report has been included in a sealed envelope for you to review at your convenience later.  YOU SHOULD EXPECT: Some feelings of bloating in the abdomen. Passage of more gas than usual.  Walking can help get rid of the air that was put into your GI tract during the procedure and reduce the bloating. If you had a lower endoscopy (such as a colonoscopy or flexible sigmoidoscopy) you may notice spotting of blood in your stool or on the toilet paper. If you underwent a bowel prep for your procedure, you may not have a normal bowel movement for a few days.  Please Note:  You might notice some irritation and congestion in your nose or some drainage.  This is from the oxygen used during your procedure.  There is no need for concern and it should clear up in a day or so.  SYMPTOMS TO REPORT IMMEDIATELY:  Following lower endoscopy (colonoscopy or flexible sigmoidoscopy):  Excessive amounts of blood in the stool  Significant tenderness or worsening of abdominal pains  Swelling of the abdomen that is new, acute  Fever of 100F or higher  For urgent or emergent issues, a gastroenterologist can be reached at any hour by calling [phone]. Do not use MyChart messaging for urgent concerns.    DIET:  We do recommend a small meal at  first, but then you may proceed to your regular diet.  Drink plenty of fluids but you should avoid alcoholic beverages for 24 hours.  ACTIVITY:  You should plan to take it easy for the rest of today and you should NOT DRIVE or use heavy machinery until tomorrow (because of the sedation medicines used during the test).    FOLLOW UP: Our staff will call the number listed on your records the next business day following your procedure.  We will call around 7:15- 8:00 am to check on you and address any questions or concerns that you may have regarding the information given to you following your procedure. If we do not reach you, we will leave a message.     If any biopsies were taken you will be contacted by phone or by letter within the next 1-3 weeks.  Please call us at [phone] if you have not heard about the biopsies in 3 weeks.    SIGNATURES/CONFIDENTIALITY: You and/or your care partner have signed paperwork which will be entered into your electronic medical record.  These signatures attest to the fact that that the information above on your After Visit Summary has been reviewed and is understood.  Full responsibility of the confidentiality of this discharge information lies with you and/or your care-partner.

## 2024-02-24 NOTE — Progress Notes (Unsigned)
 GASTROENTEROLOGY PROCEDURE H&P NOTE   Primary Care Physician: Gwenlyn Found, MD  HPI: [name] is a 68 y.o. male who presents for Colonoscopy for surveillance of prior adenomas.  Past Medical History:  Diagnosis Date   ADHD (attention deficit hyperactivity disorder)    Arthritis    RIGHT knee   GERD (gastroesophageal reflux disease)    occasionally- on PRN meds   Hyperlipidemia    not consistenly taking meds   Hypertension    Insomnia    Pre-diabetes    RLS (restless legs syndrome)    Skin cancer    Smoker    quit about acouple yrs ago   Ulnar neuropathy at elbow, left    pt denies issue   Past Surgical History:  Procedure Laterality Date   COLONOSCOPY  2020   GM-MAC-adequate with Miralax/12 TA's   PROSTATE BIOPSY     REVERSE SHOULDER ARTHROPLASTY Left 07/04/2023   Procedure: LEFT REVERSE SHOULDER ARTHROPLASTY;  Surgeon: Beverely Low, MD;  Location: [facility];  Service: Orthopedics;  Laterality: Left;  choice with interscalene block   SKIN CANCER EXCISION     THROAT SURGERY     Across from [place]   TOTAL KNEE ARTHROPLASTY Right 10/2020   TOTAL SHOULDER ARTHROPLASTY Right 10/08/2013   Procedure: RIGHT TOTAL SHOULDER ARTHROPLASTY;  Surgeon: [name], MD;  [facility]: [facility];  Service: Orthopedics;  Laterality: Right;   WISDOM TOOTH EXTRACTION     Current Outpatient Medications  Medication Sig Dispense Refill   amphetamine-dextroamphetamine (ADDERALL XR) 15 MG 24 hr capsule Take 15 mg by mouth in the morning.     Cholecalciferol (VITAMIN D PO) Take 1 tablet by mouth daily.     cyanocobalamin (VITAMIN B12) 1000 MCG tablet Take 1,000 mcg by mouth 3 (three) times a week.     gabapentin (NEURONTIN) 300 MG capsule Take 2 capsules (600 mg total) by mouth 3 (three) times daily. Please call for yearly appt to continue refills.  May also request PCP to take over prescription. 540 capsule 0   omeprazole (PRILOSEC) 20 MG capsule Take 20 mg by mouth daily.  0    zolpidem (AMBIEN) 10 MG tablet Take 1 tablet (10 mg total) by mouth at bedtime as needed for sleep. Will need appt for add'l refills. 30 tablet 0   amphetamine-dextroamphetamine (ADDERALL XR) 20 MG 24 hr capsule Take 1 capsule (20 mg total) by mouth every morning. (Patient not taking: Reported on 06/16/2023) 30 capsule 0   CIALIS 20 MG tablet TAKE ONE TABLET BY MOUTH ONCE DAILY AS NEEDED FOR ERECTILE DYSFUNCTION 10 tablet 5   gabapentin (NEURONTIN) 600 MG tablet Take 600 mg by mouth 2 (two) times daily. (Patient not taking: Reported on 02/24/2024)     linaclotide (LINZESS) 145 MCG CAPS capsule Take 145 mcg by mouth daily as needed.     meloxicam (MOBIC) 7.5 MG tablet Take 1 tablet (7.5 mg total) by mouth daily. (Patient not taking: Reported on 06/16/2023) 20 tablet 0   methocarbamol (ROBAXIN) 500 MG tablet Take 1 tablet (500 mg total) by mouth every 8 (eight) hours as needed for muscle spasms. (Patient not taking: Reported on 02/24/2024) 40 tablet 1   ondansetron (ZOFRAN) 4 MG tablet Take 1 tablet (4 mg total) by mouth every 8 (eight) hours as needed for refractory nausea / vomiting, vomiting or nausea. (Patient not taking: Reported on 02/24/2024) 30 tablet 1   oxyCODONE-acetaminophen (PERCOCET) 5-325 MG tablet Take 1-2 tablets by mouth every  4 (four) hours as needed for severe pain. (Patient not taking: Reported on 02/24/2024) 30 tablet 0   valACYclovir (VALTREX) 1000 MG tablet Take 1,000 mg by mouth daily as needed (outbreaks).     Current Facility-Administered Medications  Medication Dose Route Frequency Provider Last Rate Last Admin   0.9 %  sodium chloride infusion  500 mL Intravenous Continuous Mansouraty, Netty Starring., MD        Current Outpatient Medications:    amphetamine-dextroamphetamine (ADDERALL XR) 15 MG 24 hr capsule, Take 15 mg by mouth in the morning., Disp: , Rfl:    Cholecalciferol (VITAMIN D PO), Take 1 tablet by mouth daily., Disp: , Rfl:    cyanocobalamin (VITAMIN B12) 1000 MCG  tablet, Take 1,000 mcg by mouth 3 (three) times a week., Disp: , Rfl:    gabapentin (NEURONTIN) 300 MG capsule, Take 2 capsules (600 mg total) by mouth 3 (three) times daily. Please call for yearly appt to continue refills.  May also request PCP to take over prescription., Disp: 540 capsule, Rfl: 0   omeprazole (PRILOSEC) 20 MG capsule, Take 20 mg by mouth daily., Disp: , Rfl: 0   zolpidem (AMBIEN) 10 MG tablet, Take 1 tablet (10 mg total) by mouth at bedtime as needed for sleep. Will need appt for add'l refills., Disp: 30 tablet, Rfl: 0   amphetamine-dextroamphetamine (ADDERALL XR) 20 MG 24 hr capsule, Take 1 capsule (20 mg total) by mouth every morning. (Patient not taking: Reported on 06/16/2023), Disp: 30 capsule, Rfl: 0   CIALIS 20 MG tablet, TAKE ONE TABLET BY MOUTH ONCE DAILY AS NEEDED FOR ERECTILE DYSFUNCTION, Disp: 10 tablet, Rfl: 5   gabapentin (NEURONTIN) 600 MG tablet, Take 600 mg by mouth 2 (two) times daily. (Patient not taking: Reported on 02/24/2024), Disp: , Rfl:    linaclotide (LINZESS) 145 MCG CAPS capsule, Take 145 mcg by mouth daily as needed., Disp: , Rfl:    meloxicam (MOBIC) 7.5 MG tablet, Take 1 tablet (7.5 mg total) by mouth daily. (Patient not taking: Reported on 06/16/2023), Disp: 20 tablet, Rfl: 0   methocarbamol (ROBAXIN) 500 MG tablet, Take 1 tablet (500 mg total) by mouth every 8 (eight) hours as needed for muscle spasms. (Patient not taking: Reported on 02/24/2024), Disp: 40 tablet, Rfl: 1   ondansetron (ZOFRAN) 4 MG tablet, Take 1 tablet (4 mg total) by mouth every 8 (eight) hours as needed for refractory nausea / vomiting, vomiting or nausea. (Patient not taking: Reported on 02/24/2024), Disp: 30 tablet, Rfl: 1   oxyCODONE-acetaminophen (PERCOCET) 5-325 MG tablet, Take 1-2 tablets by mouth every 4 (four) hours as needed for severe pain. (Patient not taking: Reported on 02/24/2024), Disp: 30 tablet, Rfl: 0   valACYclovir (VALTREX) 1000 MG tablet, Take 1,000 mg by mouth daily  as needed (outbreaks)., Disp: , Rfl:   Current Facility-Administered Medications:    0.9 %  sodium chloride infusion, 500 mL, Intravenous, Continuous, Mansouraty, Netty Starring., MD No Known Allergies Family History  Problem Relation Age of Onset   Gout Father    Alzheimer's disease Father    Hypertension Brother    Pancreatic cancer Brother    Hypertension Brother    Heart Problems Mother    Colon cancer Neg Hx    Esophageal cancer Neg Hx    Rectal cancer Neg Hx    Colon polyps Neg Hx    Stomach cancer Neg Hx    Social History   Socioeconomic History   Marital status: Married  Spouse name: Not on file   Number of children: 2   Years of education: 20   Highest education level: High school graduate  Occupational History   Occupation: Surveyor, quantity: [employer]  Tobacco Use   Smoking status: Former    Current packs/day: 0.00    Average packs/day: 0.1 packs/day for 30.0 years (3.0 ttl pk-yrs)    Types: Cigarettes    Start date: 74    Quit date: 1995    Years since quitting: 30.2   Smokeless tobacco: Never   Tobacco comments:    Pt states smoking 1 and 1/2 cigs per day 04/05/13//lmr  Vaping Use   Vaping status: Never Used  Substance and Sexual Activity   Alcohol use: Yes    Comment: Occasional   Drug use: No   Sexual activity: Yes    Partners: Female    Comment: number of sex partners in the last months  1  Other Topics Concern   Not on file  Social History Narrative   Lives at home with his wife.   Right-handed.   No caffeine use.   Social Drivers of Health   Financial Resource Strain: Medium Risk (11/15/2021)   [facility] prior to 02/01/2023., [facility] visits prior to 02/01/2023.   Overall Financial Resource Strain (CARDIA)    Difficulty of Paying Living Expenses: Somewhat hard  Food Insecurity: Low Risk  (12/03/2023)   [facility]   Hunger Vital Sign     Worried About Running Out of Food in the Last Year: Never true    Ran Out of Food in the Last Year: Never true  Transportation Needs: No Transportation Needs (12/03/2023)   Received from [facility]    In the past 12 months, has lack of reliable transportation kept you from medical appointments, meetings, work or from getting things needed for daily living? : No  Physical Activity: Insufficiently Active (11/15/2021)   Received from [facility] visits prior to 02/01/2023., [facility] visits prior to 02/01/2023.   Exercise Vital Sign    Days of Exercise per Week: 4 days    Minutes of Exercise per Session: 20 min  Stress: No Stress Concern Present (11/15/2021)   [facility] visits prior to 02/01/2023., [facility] visits prior to 02/01/2023.   [employer] of Occupational Health - Occupational Stress Questionnaire    Feeling of Stress : Only a little  Social Connections: Socially Integrated (11/15/2021)   Received from [facility] visits prior to 02/01/2023., [facility] visits prior to 02/01/2023.   Social Advertising account executive [NHANES]    Frequency of Communication with Friends and Family: More than three times a week    Frequency of Social Gatherings with Friends and Family: Twice a week    Attends Religious Services: 1 to 4 times per year    Active Member of [employer] or Organizations: Yes    Attends Banker Meetings: More than 4 times per year    Marital Status: Married  Catering manager Violence: Not At Risk (11/15/2021)   [facility] visits prior to 02/01/2023., [facility] visits prior to 02/01/2023.   Humiliation, Afraid, Rape, and Kick questionnaire    Fear of Current or Ex-Partner: No  Emotionally Abused: No    Physically Abused: No    Sexually Abused:  No    Physical Exam: Today's Vitals   02/24/24 1458 02/24/24 1459  BP: (!) 140/88   Pulse: 84   Temp: 98.5 F (36.9 C) 98.5 F (36.9 C)  SpO2: 97%   Weight: 200 lb (90.7 kg)   Height: 5\' 8"  (1.727 m)    Body mass index is 30.41 kg/m. GEN: NAD EYE: Sclerae anicteric ENT: MMM CV: Non-tachycardic GI: Soft, NT/ND NEURO:  Alert & Oriented x 3  Lab Results: No results for input(s): "WBC", "HGB", "HCT", "PLT" in the last 72 hours. BMET No results for input(s): "NA", "K", "CL", "CO2", "GLUCOSE", "BUN", "CREATININE", "CALCIUM" in the last 72 hours. LFT No results for input(s): "PROT", "ALBUMIN", "AST", "ALT", "ALKPHOS", "BILITOT", "BILIDIR", "IBILI" in the last 72 hours. PT/INR No results for input(s): "LABPROT", "INR" in the last 72 hours.   Impression / Plan: This is a 68 y.o.male who presents for Colonoscopy for surveillance of prior adenomas.  The risks and benefits of endoscopic evaluation/treatment were discussed with the patient and/or family; these include but are not limited to the risk of perforation, infection, bleeding, missed lesions, lack of diagnosis, severe illness requiring hospitalization, as well as anesthesia and sedation related illnesses.  The patient's history has been reviewed, patient examined, no change in status, and deemed stable for procedure.  The patient and/or family is agreeable to proceed.    Corliss Parish, MD [facility] Office # [phone]

## 2024-02-24 NOTE — Progress Notes (Unsigned)
 Report to PACU, RN, vss, BBS= Clear.

## 2024-02-24 NOTE — Progress Notes (Signed)
 Called to room to assist during endoscopic procedure.  Patient ID and intended procedure confirmed with present staff. Received instructions for my participation in the procedure from the performing physician.

## 2024-02-24 NOTE — Progress Notes (Unsigned)
 Pt's states no medical or surgical changes since previsit or office visit.

## 2024-02-24 NOTE — Op Note (Signed)
 [facility] Patient Name: [name] Procedure Date: 02/24/2024 3:54 PM MRN: [id number] Endoscopist: Corliss Parish , MD, [id number] Age: 68 Referring MD:  Date of Birth: 02-23-1956 Gender: Male Account #: [id number] Procedure:                Colonoscopy Indications:              Surveillance: Personal history of adenomatous                            polyps on last colonoscopy 3 years ago Medicines:                Monitored Anesthesia Care Procedure:                Pre-Anesthesia Assessment:                           - Prior to the procedure, a History and Physical                            was performed, and patient medications and                            allergies were reviewed. The patient's tolerance of                            previous anesthesia was also reviewed. The risks                            and benefits of the procedure and the sedation                            options and risks were discussed with the patient.                            All questions were answered, and informed consent                            was obtained. Prior Anticoagulants: The patient has                            taken no anticoagulant or antiplatelet agents                            except for NSAID medication. ASA Grade Assessment:                            II - A patient with mild systemic disease. After                            reviewing the risks and benefits, the patient was                            deemed in satisfactory condition to undergo the  procedure.                           After obtaining informed consent, the colonoscope                            was passed under direct vision. Throughout the                            procedure, the patient's blood pressure, pulse, and                            oxygen saturations were monitored continuously. The                            Olympus Scope SN: [id number] was introduced  through                            the anus and advanced to the the cecum, identified                            by appendiceal orifice and ileocecal valve. The                            colonoscopy was performed without difficulty. The                            patient tolerated the procedure. The quality of the                            bowel preparation was adequate. The ileocecal                            valve, appendiceal orifice, and rectum were                            photographed. Scope In: 4:09:06 PM Scope Out: 4:27:32 PM Scope Withdrawal Time: 0 hours 12 minutes 41 seconds  Total Procedure Duration: 0 hours 18 minutes 26 seconds  Findings:                 Skin tags were found on perianal exam.                           The digital rectal exam findings include                            hemorrhoids. Pertinent negatives include no                            palpable rectal lesions.                           Four sessile polyps were found in the recto-sigmoid  colon, descending colon, transverse colon and                            ascending colon. The polyps were 3 to 5 mm in size.                            These polyps were removed with a cold snare.                            Resection and retrieval were complete.                           Normal mucosa was found in the entire colon                            otherwise.                           Non-bleeding non-thrombosed external and internal                            hemorrhoids were found during retroflexion, during                            perianal exam and during digital exam. The                            hemorrhoids were Grade II (internal hemorrhoids                            that prolapse but reduce spontaneously). Complications:            No immediate complications. Estimated Blood Loss:     Estimated blood loss was minimal. Impression:               - Perianal skin tags  found on perianal exam.                            Hemorrhoids found on digital rectal exam.                           - Four 3 to 5 mm polyps at the recto-sigmoid colon,                            in the descending colon, in the transverse colon                            and in the ascending colon, removed with a cold                            snare. Resected and retrieved.                           - Normal mucosa in the entire examined colon  otherwise.                           - Non-bleeding non-thrombosed external and internal                            hemorrhoids. Recommendation:           - The patient will be observed post-procedure,                            until all discharge criteria are met.                           - Discharge patient to home.                           - Patient has a contact number available for                            emergencies. The signs and symptoms of potential                            delayed complications were discussed with the                            patient. Return to normal activities tomorrow.                            Written discharge instructions were provided to the                            patient.                           - High fiber diet.                           - Use FiberCon 1-2 tablets PO daily.                           - Continue present medications.                           - Await pathology results.                           - Repeat colonoscopy in 3/5/7 years for                            surveillance based on pathology results.                           - The findings and recommendations were discussed                            with the patient.                           -  The findings and recommendations were discussed                            with the patient's family. Corliss Parish, MD 02/24/2024 4:35:26 PM

## 2024-02-25 ENCOUNTER — Telehealth: Payer: Self-pay | Admitting: *Deleted

## 2024-02-25 NOTE — Telephone Encounter (Signed)
  Follow up Call-     02/24/2024    2:59 PM  Call back number  Post procedure Call Back phone  # [phone]  Permission to leave phone message Yes     Patient questions:  Do you have a fever, pain , or abdominal swelling? No. Pain Score  0 *  Have you tolerated food without any problems? Yes.    Have you been able to return to your normal activities? Yes.    Do you have any questions about your discharge instructions: Diet   No. Medications  No. Follow up visit  No.  Do you have questions or concerns about your Care? No.  Actions: * If pain score is 4 or above: No action needed, pain <4.

## 2024-02-27 LAB — SURGICAL PATHOLOGY

## 2024-02-28 ENCOUNTER — Encounter: Payer: Self-pay | Admitting: Gastroenterology
